# Patient Record
Sex: Male | Born: 1950 | Race: White | Hispanic: No | Marital: Married | Smoking: Never smoker
Health system: Southern US, Community
[De-identification: ages and names within clinical notes are randomized; demographics above are authoritative.]

## PROBLEM LIST (undated history)

## (undated) DIAGNOSIS — M1990 Unspecified osteoarthritis, unspecified site: Secondary | ICD-10-CM

## (undated) DIAGNOSIS — I10 Essential (primary) hypertension: Secondary | ICD-10-CM

## (undated) DIAGNOSIS — Z87442 Personal history of urinary calculi: Secondary | ICD-10-CM

## (undated) HISTORY — PX: HERNIA REPAIR: SHX51

## (undated) HISTORY — PX: BACK SURGERY: SHX140

---
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
## 2005-05-08 ENCOUNTER — Other Ambulatory Visit: Payer: Self-pay

## 2005-05-14 ENCOUNTER — Ambulatory Visit: Payer: Self-pay | Admitting: Surgery

## 2006-08-09 ENCOUNTER — Ambulatory Visit: Payer: Self-pay | Admitting: Family Medicine

## 2006-10-14 ENCOUNTER — Ambulatory Visit: Payer: Self-pay | Admitting: Unknown Physician Specialty

## 2014-06-17 ENCOUNTER — Ambulatory Visit: Payer: Self-pay | Admitting: Family Medicine

## 2015-06-27 DIAGNOSIS — N138 Other obstructive and reflux uropathy: Secondary | ICD-10-CM | POA: Insufficient documentation

## 2015-06-27 DIAGNOSIS — R350 Frequency of micturition: Secondary | ICD-10-CM | POA: Insufficient documentation

## 2015-06-27 DIAGNOSIS — N401 Enlarged prostate with lower urinary tract symptoms: Secondary | ICD-10-CM | POA: Insufficient documentation

## 2015-09-12 ENCOUNTER — Other Ambulatory Visit: Payer: Self-pay | Admitting: Neurosurgery

## 2015-12-29 DIAGNOSIS — Z7189 Other specified counseling: Secondary | ICD-10-CM | POA: Insufficient documentation

## 2015-12-29 DIAGNOSIS — Z7185 Encounter for immunization safety counseling: Secondary | ICD-10-CM | POA: Insufficient documentation

## 2016-01-31 ENCOUNTER — Other Ambulatory Visit (HOSPITAL_COMMUNITY): Payer: Self-pay | Admitting: *Deleted

## 2016-01-31 ENCOUNTER — Encounter (HOSPITAL_COMMUNITY)
Admission: RE | Admit: 2016-01-31 | Discharge: 2016-01-31 | Disposition: A | Discharge status: Home / Self Care | Payer: Medicare Other | Source: Ambulatory Visit | Attending: Neurosurgery | Admitting: Neurosurgery

## 2016-01-31 ENCOUNTER — Encounter (HOSPITAL_COMMUNITY): Payer: Self-pay

## 2016-01-31 DIAGNOSIS — M1990 Unspecified osteoarthritis, unspecified site: Secondary | ICD-10-CM | POA: Insufficient documentation

## 2016-01-31 DIAGNOSIS — M5386 Other specified dorsopathies, lumbar region: Secondary | ICD-10-CM | POA: Diagnosis not present

## 2016-01-31 DIAGNOSIS — Z01812 Encounter for preprocedural laboratory examination: Secondary | ICD-10-CM | POA: Insufficient documentation

## 2016-01-31 DIAGNOSIS — I10 Essential (primary) hypertension: Secondary | ICD-10-CM | POA: Diagnosis not present

## 2016-01-31 DIAGNOSIS — Z01810 Encounter for preprocedural cardiovascular examination: Secondary | ICD-10-CM | POA: Diagnosis not present

## 2016-01-31 DIAGNOSIS — I4510 Unspecified right bundle-branch block: Secondary | ICD-10-CM | POA: Insufficient documentation

## 2016-01-31 DIAGNOSIS — R001 Bradycardia, unspecified: Secondary | ICD-10-CM | POA: Insufficient documentation

## 2016-01-31 HISTORY — DX: Unspecified osteoarthritis, unspecified site: M19.90

## 2016-01-31 HISTORY — DX: Essential (primary) hypertension: I10

## 2016-01-31 HISTORY — DX: Personal history of urinary calculi: Z87.442

## 2016-01-31 LAB — BASIC METABOLIC PANEL
Anion gap: 9 (ref 5–15)
BUN: 11 mg/dL (ref 6–20)
CALCIUM: 9.7 mg/dL (ref 8.9–10.3)
CO2: 27 mmol/L (ref 22–32)
CREATININE: 0.8 mg/dL (ref 0.61–1.24)
Chloride: 101 mmol/L (ref 101–111)
GFR calc non Af Amer: >60 mL/min (ref >60)
Glucose, Bld: 86 mg/dL (ref 65–99)
Potassium: 3.8 mmol/L (ref 3.5–5.1)
SODIUM: 137 mmol/L (ref 135–145)

## 2016-01-31 LAB — CBC
HCT: 46 % (ref 39.0–52.0)
Hemoglobin: 16.1 g/dL (ref 13.0–17.0)
MCH: 32.1 pg (ref 26.0–34.0)
MCHC: 35 g/dL (ref 30.0–36.0)
MCV: 91.6 fL (ref 78.0–100.0)
PLATELETS: 230 10*3/uL (ref 150–400)
RBC: 5.02 MIL/uL (ref 4.22–5.81)
RDW: 12.6 % (ref 11.5–15.5)
WBC: 7.2 10*3/uL (ref 4.0–10.5)

## 2016-01-31 LAB — TYPE AND SCREEN
ABO/RH(D): A NEG
ANTIBODY SCREEN: NEGATIVE

## 2016-01-31 LAB — SURGICAL PCR SCREEN
MRSA, PCR: NEGATIVE
Staphylococcus aureus: NEGATIVE

## 2016-01-31 LAB — ABO/RH: ABO/RH(D): A NEG

## 2016-01-31 NOTE — Progress Notes (Signed)
Pt. Denies all chest & breathing concerns. Pt. Followed at [facility] office, states prior to hernia surgery he had a treadmill stress test (approx. 10 yrs. Ago).  Pt. Told that it was wnl, not referred to cardiology for ongoing care.

## 2016-01-31 NOTE — Progress Notes (Signed)
   01/31/16 0957  OBSTRUCTIVE SLEEP APNEA  Do you snore loudly (loud enough to be heard through closed doors)?  0  Do you often feel tired, fatigued, or sleepy during the daytime (such as falling asleep during driving or talking to someone)? 1  Has anyone observed you stop breathing during your sleep? 0  Do you have, or are you being treated for high blood pressure? 1  BMI more than 35 kg/m2? 0  Neck circumference greater than:Male 16 inches or larger, Male 17inches or larger? 1 (19 inches )  Male Gender (Yes=1) 1  Obstructive Sleep Apnea Score 4  Score 5 or greater  No PCP;Results sent to PCP

## 2016-01-31 NOTE — Progress Notes (Signed)
Faxed sleep report to pcp- Dr. Burnadette PopLinthavong

## 2016-01-31 NOTE — Pre-Procedure Instructions (Signed)
Nathan Curtis  01/31/2016      [facility] [place] - [address] [place] [id number] Phone: [phone] Fax: [phone]    Your procedure is scheduled on 02/08/2016  [facility] Admitting at 6:30 A.M.  Call this number if you have problems the morning of surgery:  [phone]   Remember:  Do not eat food or drink liquids after midnight.  Take these medicines the morning of surgery with A SIP OF WATER : NOTHING   Do not wear jewelry   Do not wear lotions, powders, or perfumes, or deoderant.     Men may shave face and neck.   Do not bring valuables to the hospital.   [facility] is not responsible for any belongings or valuables.  Contacts, dentures or bridgework may not be worn into surgery.  Leave your suitcase in the car.  After surgery it may be brought to your room.  For patients admitted to the hospital, discharge time will be determined by your treatment team.  Patients discharged the day of surgery will not be allowed to drive home.   Name and phone number of your driver:  Wife  Special instructions:  Special Instructions:  - Preparing for Surgery  Before surgery, you can play an important role.  Because skin is not sterile, your skin needs to be as free of germs as possible.  You can reduce the number of germs on you skin by washing with CHG (chlorahexidine gluconate) soap before surgery.  CHG is an antiseptic cleaner which kills germs and bonds with the skin to continue killing germs even after washing.  Please DO NOT use if you have an allergy to CHG or antibacterial soaps.  If your skin becomes reddened/irritated stop using the CHG and inform your nurse when you arrive at Short Stay.  Do not shave (including legs and underarms) for at least 48 hours prior to the first CHG shower.  You may shave your face.  Please follow these instructions carefully:   1.  Shower with  CHG Soap the night before surgery and the  morning of Surgery.  2.  If you choose to wash your hair, wash your hair first as usual with your  normal shampoo.  3.  After you shampoo, rinse your hair and body thoroughly to remove the  Shampoo.  4.  Use CHG as you would any other liquid soap.  You can apply chg directly to the skin and wash gently with scrungie or a clean washcloth.  5.  Apply the CHG Soap to your body ONLY FROM THE NECK DOWN.    Do not use on open wounds or open sores.  Avoid contact with your eyes, ears, mouth and genitals (private parts).  Wash genitals (private parts)   with your normal soap.  6.  Wash thoroughly, paying special attention to the area where your surgery will be performed.  7.  Thoroughly rinse your body with warm water from the neck down.  8.  DO NOT shower/wash with your normal soap after using and rinsing off   the CHG Soap.  9.  Pat yourself dry with a clean towel.            10.  Wear clean pajamas.            11.  Place clean sheets on your bed the night of your first shower and do  not sleep with pets.  Day of Surgery  Do not apply any lotions/deodorants the morning of surgery.  Please wear clean clothes to the hospital/surgery center.  Please read over the following fact sheets that you were given. Pain Booklet, Coughing and Deep Breathing, MRSA Information and Surgical Site Infection Prevention

## 2016-02-08 ENCOUNTER — Inpatient Hospital Stay (HOSPITAL_COMMUNITY): Payer: Medicare Other

## 2016-02-08 ENCOUNTER — Inpatient Hospital Stay (HOSPITAL_COMMUNITY): Payer: Medicare Other | Admitting: Anesthesiology

## 2016-02-08 ENCOUNTER — Encounter (HOSPITAL_COMMUNITY)
Admission: RE | Disposition: A | Discharge status: Home / Self Care | Payer: Self-pay | Source: Ambulatory Visit | Attending: Neurosurgery

## 2016-02-08 ENCOUNTER — Inpatient Hospital Stay (HOSPITAL_COMMUNITY)
Admission: RE | Admit: 2016-02-08 | Discharge: 2016-02-10 | DRG: 460 | Disposition: A | Discharge status: Home / Self Care | Payer: Medicare Other | Source: Ambulatory Visit | Attending: Neurosurgery | Admitting: Neurosurgery

## 2016-02-08 DIAGNOSIS — M5116 Intervertebral disc disorders with radiculopathy, lumbar region: Secondary | ICD-10-CM | POA: Diagnosis present

## 2016-02-08 DIAGNOSIS — I10 Essential (primary) hypertension: Secondary | ICD-10-CM | POA: Diagnosis present

## 2016-02-08 DIAGNOSIS — M48062 Spinal stenosis, lumbar region with neurogenic claudication: Secondary | ICD-10-CM | POA: Diagnosis present

## 2016-02-08 DIAGNOSIS — M4316 Spondylolisthesis, lumbar region: Secondary | ICD-10-CM | POA: Diagnosis present

## 2016-02-08 DIAGNOSIS — Z7951 Long term (current) use of inhaled steroids: Secondary | ICD-10-CM

## 2016-02-08 DIAGNOSIS — Z7982 Long term (current) use of aspirin: Secondary | ICD-10-CM | POA: Diagnosis not present

## 2016-02-08 DIAGNOSIS — Z419 Encounter for procedure for purposes other than remedying health state, unspecified: Secondary | ICD-10-CM

## 2016-02-08 DIAGNOSIS — M549 Dorsalgia, unspecified: Secondary | ICD-10-CM | POA: Diagnosis present

## 2016-02-08 SURGERY — POSTERIOR LUMBAR FUSION 2 LEVEL
Anesthesia: General | Site: Back

## 2016-02-08 MED ORDER — LOSARTAN POTASSIUM 50 MG PO TABS
50.0000 mg | ORAL_TABLET | Freq: Every day | ORAL | Status: DC
  Administered 2016-02-09 – 2016-02-10 (×2): 50 mg via ORAL
  Filled 2016-02-08 (×2): qty 1

## 2016-02-08 MED ORDER — THROMBIN 20000 UNITS EX SOLR
CUTANEOUS | Status: AC
  Filled 2016-02-08: qty 20000

## 2016-02-08 MED ORDER — SODIUM CHLORIDE 0.9 % IR SOLN
Status: DC | PRN
  Administered 2016-02-08: 10:00:00

## 2016-02-08 MED ORDER — MIDAZOLAM HCL 2 MG/2ML IJ SOLN
INTRAMUSCULAR | Status: AC
  Filled 2016-02-08: qty 2

## 2016-02-08 MED ORDER — SIMVASTATIN 20 MG PO TABS
20.0000 mg | ORAL_TABLET | Freq: Every day | ORAL | Status: DC
  Administered 2016-02-08 – 2016-02-09 (×2): 20 mg via ORAL
  Filled 2016-02-08 (×2): qty 1

## 2016-02-08 MED ORDER — HYDROCHLOROTHIAZIDE 25 MG PO TABS
25.0000 mg | ORAL_TABLET | Freq: Every day | ORAL | Status: DC
  Administered 2016-02-09 – 2016-02-10 (×2): 25 mg via ORAL
  Filled 2016-02-08 (×2): qty 1

## 2016-02-08 MED ORDER — ADULT MULTIVITAMIN W/MINERALS CH
1.0000 | ORAL_TABLET | Freq: Every day | ORAL | Status: DC
  Administered 2016-02-10: 1 via ORAL
  Filled 2016-02-08: qty 1

## 2016-02-08 MED ORDER — EPHEDRINE SULFATE 50 MG/ML IJ SOLN
INTRAMUSCULAR | Status: DC | PRN
  Administered 2016-02-08: 5 mg via INTRAVENOUS
  Administered 2016-02-08: 10 mg via INTRAVENOUS
  Administered 2016-02-08: 5 mg via INTRAVENOUS

## 2016-02-08 MED ORDER — FLUTICASONE PROPIONATE 50 MCG/ACT NA SUSP
1.0000 | Freq: Every day | NASAL | Status: DC | PRN
  Filled 2016-02-08: qty 16

## 2016-02-08 MED ORDER — FENTANYL CITRATE (PF) 100 MCG/2ML IJ SOLN
INTRAMUSCULAR | Status: AC
  Filled 2016-02-08: qty 4

## 2016-02-08 MED ORDER — ALBUMIN HUMAN 5 % IV SOLN
INTRAVENOUS | Status: DC | PRN
  Administered 2016-02-08: 11:00:00 via INTRAVENOUS

## 2016-02-08 MED ORDER — MENTHOL 3 MG MT LOZG: 1.0000 | LOZENGE | OROMUCOSAL | Status: DC | PRN

## 2016-02-08 MED ORDER — VANCOMYCIN HCL 1000 MG IV SOLR
INTRAVENOUS | Status: DC | PRN
  Administered 2016-02-08: 1000 mg via TOPICAL

## 2016-02-08 MED ORDER — LACTATED RINGERS IV SOLN
INTRAVENOUS | Status: DC | PRN
  Administered 2016-02-08 (×3): via INTRAVENOUS

## 2016-02-08 MED ORDER — BACITRACIN ZINC 500 UNIT/GM EX OINT
TOPICAL_OINTMENT | CUTANEOUS | Status: AC
  Filled 2016-02-08: qty 28.35

## 2016-02-08 MED ORDER — MORPHINE SULFATE (PF) 4 MG/ML IV SOLN
1.0000 mg | INTRAVENOUS | Status: DC | PRN
  Administered 2016-02-08: 4 mg via INTRAVENOUS
  Filled 2016-02-08: qty 1

## 2016-02-08 MED ORDER — OXYCODONE HCL 5 MG/5ML PO SOLN: 5.0000 mg | Freq: Once | ORAL | Status: DC | PRN

## 2016-02-08 MED ORDER — FENTANYL CITRATE (PF) 100 MCG/2ML IJ SOLN
INTRAMUSCULAR | Status: DC | PRN
Start: 2016-02-08 — End: 2016-02-08
  Administered 2016-02-08: 100 ug via INTRAVENOUS
  Administered 2016-02-08 (×4): 50 ug via INTRAVENOUS

## 2016-02-08 MED ORDER — ARTIFICIAL TEARS OP OINT
TOPICAL_OINTMENT | OPHTHALMIC | Status: AC
  Filled 2016-02-08: qty 7

## 2016-02-08 MED ORDER — ONDANSETRON HCL 4 MG/2ML IJ SOLN
4.0000 mg | INTRAMUSCULAR | Status: DC | PRN
  Administered 2016-02-08 – 2016-02-09 (×2): 4 mg via INTRAVENOUS
  Filled 2016-02-08 (×2): qty 2

## 2016-02-08 MED ORDER — PROPOFOL 10 MG/ML IV BOLUS
INTRAVENOUS | Status: DC | PRN
  Administered 2016-02-08: 200 mg via INTRAVENOUS

## 2016-02-08 MED ORDER — VANCOMYCIN HCL 1000 MG IV SOLR
INTRAVENOUS | Status: AC
  Filled 2016-02-08: qty 1000

## 2016-02-08 MED ORDER — HYDROMORPHONE HCL 2 MG/ML IJ SOLN
INTRAMUSCULAR | Status: AC
  Filled 2016-02-08: qty 1

## 2016-02-08 MED ORDER — BUPIVACAINE-EPINEPHRINE (PF) 0.5% -1:200000 IJ SOLN
INTRAMUSCULAR | Status: DC | PRN
  Administered 2016-02-08: 10 mL

## 2016-02-08 MED ORDER — OXYCODONE HCL 5 MG PO TABS: 5.0000 mg | ORAL_TABLET | Freq: Once | ORAL | Status: DC | PRN

## 2016-02-08 MED ORDER — ACETAMINOPHEN 325 MG PO TABS
650.0000 mg | ORAL_TABLET | ORAL | Status: DC | PRN
  Administered 2016-02-09 – 2016-02-10 (×3): 650 mg via ORAL
  Filled 2016-02-08 (×3): qty 2

## 2016-02-08 MED ORDER — FENTANYL CITRATE (PF) 100 MCG/2ML IJ SOLN
INTRAMUSCULAR | Status: AC
  Filled 2016-02-08: qty 2

## 2016-02-08 MED ORDER — DEXTROSE 5 % IV SOLN
INTRAVENOUS | Status: DC | PRN
  Administered 2016-02-08: 50 ug/min via INTRAVENOUS

## 2016-02-08 MED ORDER — BISACODYL 10 MG RE SUPP: 10.0000 mg | Freq: Every day | RECTAL | Status: DC | PRN

## 2016-02-08 MED ORDER — PHENOL 1.4 % MT LIQD: 1.0000 | OROMUCOSAL | Status: DC | PRN

## 2016-02-08 MED ORDER — PHENYLEPHRINE HCL 10 MG/ML IJ SOLN
INTRAMUSCULAR | Status: AC
  Filled 2016-02-08: qty 1

## 2016-02-08 MED ORDER — DOCUSATE SODIUM 100 MG PO CAPS
100.0000 mg | ORAL_CAPSULE | Freq: Two times a day (BID) | ORAL | Status: DC
  Administered 2016-02-08 – 2016-02-10 (×4): 100 mg via ORAL
  Filled 2016-02-08 (×4): qty 1

## 2016-02-08 MED ORDER — HYDROCODONE-ACETAMINOPHEN 5-325 MG PO TABS: 1.0000 | ORAL_TABLET | ORAL | Status: DC | PRN

## 2016-02-08 MED ORDER — OXYCODONE-ACETAMINOPHEN 5-325 MG PO TABS
ORAL_TABLET | ORAL | Status: AC
  Filled 2016-02-08: qty 2

## 2016-02-08 MED ORDER — LIDOCAINE HCL (CARDIAC) 20 MG/ML IV SOLN
INTRAVENOUS | Status: DC | PRN
  Administered 2016-02-08: 60 mg via INTRAVENOUS

## 2016-02-08 MED ORDER — DIAZEPAM 5 MG PO TABS
ORAL_TABLET | ORAL | Status: AC
  Filled 2016-02-08: qty 1

## 2016-02-08 MED ORDER — ARTIFICIAL TEARS OP OINT
TOPICAL_OINTMENT | OPHTHALMIC | Status: DC | PRN
  Administered 2016-02-08: 1 via OPHTHALMIC

## 2016-02-08 MED ORDER — ACETAMINOPHEN 650 MG RE SUPP: 650.0000 mg | RECTAL | Status: DC | PRN

## 2016-02-08 MED ORDER — ONDANSETRON HCL 4 MG/2ML IJ SOLN
INTRAMUSCULAR | Status: DC | PRN
  Administered 2016-02-08: 4 mg via INTRAVENOUS

## 2016-02-08 MED ORDER — CEFAZOLIN SODIUM-DEXTROSE 2-4 GM/100ML-% IV SOLN
2.0000 g | INTRAVENOUS | Status: AC
  Administered 2016-02-08 (×2): 2 g via INTRAVENOUS
  Filled 2016-02-08: qty 100

## 2016-02-08 MED ORDER — THROMBIN 20000 UNITS EX SOLR
CUTANEOUS | Status: DC | PRN
  Administered 2016-02-08: 10:00:00 via TOPICAL

## 2016-02-08 MED ORDER — ONDANSETRON HCL 4 MG/2ML IJ SOLN
INTRAMUSCULAR | Status: AC
  Filled 2016-02-08: qty 2

## 2016-02-08 MED ORDER — MIDAZOLAM HCL 5 MG/5ML IJ SOLN
INTRAMUSCULAR | Status: DC | PRN
  Administered 2016-02-08: 2 mg via INTRAVENOUS

## 2016-02-08 MED ORDER — SUGAMMADEX SODIUM 200 MG/2ML IV SOLN
INTRAVENOUS | Status: AC
  Filled 2016-02-08: qty 2

## 2016-02-08 MED ORDER — ONDANSETRON HCL 4 MG/2ML IJ SOLN
4.0000 mg | Freq: Four times a day (QID) | INTRAMUSCULAR | Status: AC | PRN
  Administered 2016-02-08: 4 mg via INTRAVENOUS

## 2016-02-08 MED ORDER — PHENYLEPHRINE HCL 10 MG/ML IJ SOLN
INTRAMUSCULAR | Status: DC | PRN
  Administered 2016-02-08: 80 ug via INTRAVENOUS

## 2016-02-08 MED ORDER — ROCURONIUM BROMIDE 100 MG/10ML IV SOLN
INTRAVENOUS | Status: DC | PRN
  Administered 2016-02-08: 20 mg via INTRAVENOUS
  Administered 2016-02-08: 50 mg via INTRAVENOUS
  Administered 2016-02-08: 10 mg via INTRAVENOUS
  Administered 2016-02-08: 20 mg via INTRAVENOUS

## 2016-02-08 MED ORDER — ALUM & MAG HYDROXIDE-SIMETH 200-200-20 MG/5ML PO SUSP
30.0000 mL | Freq: Four times a day (QID) | ORAL | Status: DC | PRN
  Filled 2016-02-08 (×2): qty 30

## 2016-02-08 MED ORDER — BUPIVACAINE LIPOSOME 1.3 % IJ SUSP
INTRAMUSCULAR | Status: DC | PRN
  Administered 2016-02-08: 20 mL

## 2016-02-08 MED ORDER — PROPOFOL 10 MG/ML IV BOLUS
INTRAVENOUS | Status: AC
  Filled 2016-02-08: qty 20

## 2016-02-08 MED ORDER — BACITRACIN ZINC 500 UNIT/GM EX OINT
TOPICAL_OINTMENT | CUTANEOUS | Status: DC | PRN
  Administered 2016-02-08: 1 via TOPICAL

## 2016-02-08 MED ORDER — DIAZEPAM 5 MG PO TABS
5.0000 mg | ORAL_TABLET | Freq: Four times a day (QID) | ORAL | Status: DC | PRN
  Administered 2016-02-08 – 2016-02-10 (×3): 5 mg via ORAL
  Filled 2016-02-08 (×2): qty 1

## 2016-02-08 MED ORDER — OXYCODONE-ACETAMINOPHEN 5-325 MG PO TABS
1.0000 | ORAL_TABLET | ORAL | Status: DC | PRN
  Administered 2016-02-08 – 2016-02-09 (×4): 2 via ORAL
  Filled 2016-02-08 (×3): qty 2

## 2016-02-08 MED ORDER — LACTATED RINGERS IV SOLN: INTRAVENOUS | Status: DC

## 2016-02-08 MED ORDER — MORPHINE SULFATE (PF) 2 MG/ML IV SOLN: 1.0000 mg | INTRAVENOUS | Status: DC | PRN

## 2016-02-08 MED ORDER — CEFAZOLIN SODIUM-DEXTROSE 2-4 GM/100ML-% IV SOLN
2.0000 g | Freq: Three times a day (TID) | INTRAVENOUS | Status: AC
  Administered 2016-02-08 (×2): 2 g via INTRAVENOUS
  Filled 2016-02-08 (×2): qty 100

## 2016-02-08 MED ORDER — CEFAZOLIN SODIUM 1 G IJ SOLR
INTRAMUSCULAR | Status: AC
  Filled 2016-02-08: qty 20

## 2016-02-08 MED ORDER — HYDROMORPHONE HCL 1 MG/ML IJ SOLN
0.2500 mg | INTRAMUSCULAR | Status: DC | PRN
  Administered 2016-02-08 (×2): 0.5 mg via INTRAVENOUS

## 2016-02-08 MED FILL — Sodium Chloride IV Soln 0.9%: INTRAVENOUS | Qty: 1000 | Status: AC

## 2016-02-08 MED FILL — Heparin Sodium (Porcine) Inj 1000 Unit/ML: INTRAMUSCULAR | Qty: 30 | Status: AC

## 2016-02-08 SURGICAL SUPPLY — 73 items
BAG DECANTER FOR FLEXI CONT (MISCELLANEOUS) ×3 IMPLANT
BASKET BONE COLLECTION (BASKET) ×3 IMPLANT
BENZOIN TINCTURE PRP APPL 2/3 (GAUZE/BANDAGES/DRESSINGS) ×3 IMPLANT
BLADE CLIPPER SURG (BLADE) ×3 IMPLANT
BLADE SURG 15 STRL LF DISP TIS (BLADE) ×1 IMPLANT
BLADE SURG 15 STRL SS (BLADE) ×2
BUR MATCHSTICK NEURO 3.0 LAGG (BURR) ×3 IMPLANT
BUR PRECISION FLUTE 6.0 (BURR) ×3 IMPLANT
CAGE ALTERA 10X31X9-13 15D (Cage) ×2 IMPLANT
CAGE ALTERA 9-13-15-31MM (Cage) ×1 IMPLANT
CANISTER SUCT 3000ML PPV (MISCELLANEOUS) ×3 IMPLANT
CAP REVERE LOCKING (Cap) ×18 IMPLANT
CLOSURE WOUND 1/2 X4 (GAUZE/BANDAGES/DRESSINGS) ×1
CONN CROSSLINK REV 38-50MM (Connector) ×3 IMPLANT
CONNECTOR CRSLINK REV 38-50MM (Connector) ×1 IMPLANT
CONT SPEC 4OZ CLIKSEAL STRL BL (MISCELLANEOUS) ×3 IMPLANT
COVER BACK TABLE 60X90IN (DRAPES) ×3 IMPLANT
COVER TABLE BACK 60X90 (DRAPES) ×3 IMPLANT
DEVICE RISE INTERBODY CREO (Neuro Prosthesis/Implant) ×2 IMPLANT
DRAPE C-ARM 42X72 X-RAY (DRAPES) ×6 IMPLANT
DRAPE HALF SHEET 40X57 (DRAPES) ×3 IMPLANT
DRAPE LAPAROTOMY 100X72X124 (DRAPES) ×3 IMPLANT
DRAPE POUCH INSTRU U-SHP 10X18 (DRAPES) ×3 IMPLANT
DRAPE SURG 17X23 STRL (DRAPES) ×12 IMPLANT
ELECT BLADE 4.0 EZ CLEAN MEGAD (MISCELLANEOUS) ×3
ELECT REM PT RETURN 9FT ADLT (ELECTROSURGICAL) ×3
ELECTRODE BLDE 4.0 EZ CLN MEGD (MISCELLANEOUS) ×1 IMPLANT
ELECTRODE REM PT RTRN 9FT ADLT (ELECTROSURGICAL) ×1 IMPLANT
GAUZE SPONGE 4X4 12PLY STRL (GAUZE/BANDAGES/DRESSINGS) ×3 IMPLANT
GAUZE SPONGE 4X4 16PLY XRAY LF (GAUZE/BANDAGES/DRESSINGS) ×3 IMPLANT
GLOVE BIO SURGEON STRL SZ7 (GLOVE) ×9 IMPLANT
GLOVE BIO SURGEON STRL SZ8 (GLOVE) ×9 IMPLANT
GLOVE BIO SURGEON STRL SZ8.5 (GLOVE) ×6 IMPLANT
GLOVE BIOGEL PI IND STRL 6.5 (GLOVE) ×1 IMPLANT
GLOVE BIOGEL PI IND STRL 7.5 (GLOVE) ×2 IMPLANT
GLOVE BIOGEL PI INDICATOR 6.5 (GLOVE) ×2
GLOVE BIOGEL PI INDICATOR 7.5 (GLOVE) ×4
GLOVE EXAM NITRILE LRG STRL (GLOVE) IMPLANT
GLOVE EXAM NITRILE XL STR (GLOVE) IMPLANT
GLOVE EXAM NITRILE XS STR PU (GLOVE) IMPLANT
GLOVE INDICATOR 8.5 STRL (GLOVE) ×3 IMPLANT
GLOVE SURG SS PI 6.5 STRL IVOR (GLOVE) ×9 IMPLANT
GOWN STRL REUS W/ TWL LRG LVL3 (GOWN DISPOSABLE) ×2 IMPLANT
GOWN STRL REUS W/ TWL XL LVL3 (GOWN DISPOSABLE) ×3 IMPLANT
GOWN STRL REUS W/TWL 2XL LVL3 (GOWN DISPOSABLE) IMPLANT
GOWN STRL REUS W/TWL LRG LVL3 (GOWN DISPOSABLE) ×4
GOWN STRL REUS W/TWL XL LVL3 (GOWN DISPOSABLE) ×6
KIT BASIN OR (CUSTOM PROCEDURE TRAY) ×3 IMPLANT
KIT ROOM TURNOVER OR (KITS) ×3 IMPLANT
NEEDLE HYPO 21X1.5 SAFETY (NEEDLE) ×3 IMPLANT
NEEDLE HYPO 22GX1.5 SAFETY (NEEDLE) ×3 IMPLANT
NS IRRIG 1000ML POUR BTL (IV SOLUTION) ×3 IMPLANT
PACK LAMINECTOMY NEURO (CUSTOM PROCEDURE TRAY) ×3 IMPLANT
PAD ARMBOARD 7.5X6 YLW CONV (MISCELLANEOUS) ×9 IMPLANT
PATTIES SURGICAL .5 X.5 (GAUZE/BANDAGES/DRESSINGS) ×3 IMPLANT
PATTIES SURGICAL .5 X1 (DISPOSABLE) IMPLANT
PATTIES SURGICAL 1X1 (DISPOSABLE) ×3 IMPLANT
RISE INTERBODY CREO (Neuro Prosthesis/Implant) ×6 IMPLANT
ROD REVERE 7.5MM (Rod) ×6 IMPLANT
SCREW 7.5X50MM (Screw) ×18 IMPLANT
SPONGE LAP 4X18 X RAY DECT (DISPOSABLE) IMPLANT
SPONGE NEURO XRAY DETECT 1X3 (DISPOSABLE) IMPLANT
SPONGE SURGIFOAM ABS GEL 100 (HEMOSTASIS) ×3 IMPLANT
STRIP BIOACTIVE 20CC 25X100X8 (Miscellaneous) ×3 IMPLANT
STRIP CLOSURE SKIN 1/2X4 (GAUZE/BANDAGES/DRESSINGS) ×2 IMPLANT
SUT VIC AB 1 CT1 18XBRD ANBCTR (SUTURE) ×2 IMPLANT
SUT VIC AB 1 CT1 8-18 (SUTURE) ×4
SUT VIC AB 2-0 CP2 18 (SUTURE) ×6 IMPLANT
TAPE CLOTH SURG 4X10 WHT LF (GAUZE/BANDAGES/DRESSINGS) ×3 IMPLANT
TOWEL OR 17X24 6PK STRL BLUE (TOWEL DISPOSABLE) ×3 IMPLANT
TOWEL OR 17X26 10 PK STRL BLUE (TOWEL DISPOSABLE) ×3 IMPLANT
TRAY FOLEY W/METER SILVER 16FR (SET/KITS/TRAYS/PACK) ×3 IMPLANT
WATER STERILE IRR 1000ML POUR (IV SOLUTION) ×3 IMPLANT

## 2016-02-08 NOTE — Anesthesia Procedure Notes (Signed)
Procedure Name: Intubation Date/Time: 02/08/2016 8:39 AM Performed by: Carmela RimaMARTINELLI, Tayllor Breitenstein F Pre-anesthesia Checklist: Timeout performed, Patient being monitored, Emergency Drugs available and Patient identified Patient Re-evaluated:Patient Re-evaluated prior to inductionOxygen Delivery Method: Circle system utilized Preoxygenation: Pre-oxygenation with 100% oxygen Intubation Type: IV induction Ventilation: Mask ventilation without difficulty Laryngoscope Size: Mac, 3 and Glidescope Grade View: Grade III Tube type: Oral Tube size: 7.5 mm Number of attempts: 2 Placement Confirmation: breath sounds checked- equal and bilateral,  positive ETCO2 and ETT inserted through vocal cords under direct vision Secured at: 23 cm Tube secured with: Tape Dental Injury: Teeth and Oropharynx as per pre-operative assessment  Comments: Narrow mandible.  Anterior anatomy

## 2016-02-08 NOTE — Anesthesia Postprocedure Evaluation (Signed)
Anesthesia Post Note  Patient: Nathan Curtis  Procedure(s) Performed: Procedure(s) (LRB): Lumbar two-three, Lumbar three-four Posterior lumbar interbody fusion/Interbody prosthesis/Posterior segmental instrumentation/Posterior lumbar arthrodesis (N/A)  Patient location during evaluation: PACU Anesthesia Type: General Level of consciousness: awake and alert and patient cooperative Pain management: pain level controlled Vital Signs Assessment: post-procedure vital signs reviewed and stable Respiratory status: spontaneous breathing and respiratory function stable Cardiovascular status: stable Anesthetic complications: no    Last Vitals:  Vitals:   02/08/16 1515 02/08/16 1530  BP: (!) 131/53 (!) 101/54  Pulse: 73 74  Resp: 15 13  Temp:      Last Pain:  Vitals:   02/08/16 1515  TempSrc:   PainSc: 6                  Sted[place] S

## 2016-02-08 NOTE — Op Note (Signed)
Brief history: The patient is a 65 year old white male who has complained of back, buttock, and leg pain consistent with neurogenic claudication. He has failed medical management and was worked up with lumbar x-rays and a lumbar MRI. This demonstrated degenerative disc disease, spondylolisthesis, spinal stenosis, etc. at L2-3 and L3-4. I discussed the various treatment options with the patient including surgery. He has weighed the risks, benefits, and alternatives to surgery and decided proceed with an L2-3 and L3-4 decompression, instrumentation, and fusion.  Preoperative diagnosis: L2-3 and L3-4 Degenerative disc disease, L3-4 spondylolisthesis, spinal stenosis compressing both the L3 and L4 nerve roots; lumbago; lumbar radiculopathy  Postoperative diagnosis: The same   Procedure: L3-4 laminectomy, L2-3 bilateral Laminotomy/foraminotomies to decompress the bilateral L3 and L4 nerve roots(the work required to do this was in addition to the work required to do the posterior lumbar interbody fusion because of the patient's spinal stenosis, facet arthropathy. Etc. requiring a wide decompression of the nerve roots.); L2-3 and L3-4 posterior lumbar interbody fusion with local morselized autograft bone and Kinnex graft extender; insertion of interbody prosthesis at L2-3 and L3-4 (globus peek interbody prosthesis); posterior segmental instrumentation from L2 to L4 with globus titanium pedicle screws and rods; posterior lateral arthrodesis at L2-3 and L3-4 with local morselized autograft bone and Kinnex bone graft extender.  Surgeon: Dr. Delma OfficerJeff Aleisha Paone  Asst.: Dr. Wynetta Emeryram  Anesthesia: Gen. endotracheal  Estimated blood loss: 300 mL  Drains: None  Complications: None  Description of procedure: The patient was brought to the operating room by the anesthesia team. General endotracheal anesthesia was induced. The patient was turned to the prone position on the Wilson frame. The patient's lumbosacral region was  then prepared with Betadine scrub and Betadine solution. Sterile drapes were applied.  I then injected the area to be incised with Marcaine with epinephrine solution. I then used the scalpel to make a linear midline incision over the L2-3 and L3-4 interspace. I then used electrocautery to perform a bilateral subperiosteal dissection exposing the spinous process and lamina of L2, L3 and L4. We then obtained intraoperative radiograph to confirm our location. We then inserted the Verstrac retractor to provide exposure.  I began the decompression incising the L2-3 and L3-4 interspinous ligament. I used the Leksell to remove the L3 spinous process. We used the high speed drill to perform laminotomies at L2-3 and L3-4 bilaterally. We then used the Kerrison punches to complete the L3 laminectomy and to widen the laminotomies bilaterally at L2-3 widen the laminotomy and removed the ligamentum flavum at L2-3 and L3-4. We used the Kerrison punches to remove the medial facets at L2-3 and L3-4 bilaterally. We performed wide foraminotomies about the bilateral L3 and L4 nerve roots completing the decompression.  We now turned our attention to the posterior lumbar interbody fusion. I used a scalpel to incise the intervertebral disc at L2-3 and L3-4 bilaterally. I then performed a partial intervertebral discectomy at L2-3 and L3-4 bilaterally using the pituitary forceps. We prepared the vertebral endplates at L2-3 and L3-4 bilaterally for the fusion by removing the soft tissues with the curettes. We then used the trial spacers to pick the appropriate sized interbody prosthesis. We prefilled his prosthesis with a combination of local morselized autograft bone that we obtained during the decompression as well as Kinnex bone graft extender. We inserted the prefilled prosthesis into the interspace at L2-3 bilaterally and L3-4 from the left, we expanded the prosthesis. There was a good snug fit of the prosthesis in the  interspace.  We then filled and the remainder of the intervertebral disc space with local morselized autograft bone and Kinnex. This completed the posterior lumbar and transforaminal interbody arthrodesis.  We now turned attention to the instrumentation. Under fluoroscopic guidance we cannulated the bilateral L2, L3 and L4 pedicles with the bone probe. We then removed the bone probe. We then tapped the pedicle with a 6.5 millimeter tap. We then removed the tap. We probed inside the tapped pedicle with a ball probe to rule out cortical breaches. We then inserted a 7.5 x 50 millimeter pedicle screw into the L2, L3 and L4 pedicles bilaterally under fluoroscopic guidance. We then palpated along the medial aspect of the pedicles to rule out cortical breaches. There were none. The nerve roots were not injured. We then connected the unilateral pedicle screws with a lordotic rod. We compressed the construct and secured the rod in place with the caps. We then tightened the caps appropriately. We placed a cross connector between the rods.  This completed the instrumentation from L2-L4.  We now turned our attention to the posterior lateral arthrodesis at L2-3 and L3-4 bilaterally. We used the high-speed drill to decorticate the remainder of the facets, pars, transverse process at L2-3 and L3-4 bilaterally. We then applied a combination of local morselized autograft bone and Kinnex bone graft extender over these decorticated posterior lateral structures. This completed the posterior lateral arthrodesis.  We then obtained hemostasis using bipolar electrocautery. We irrigated the wound out with bacitracin solution. We inspected the thecal sac and nerve roots and noted they were well decompressed. We then removed the retractor. We placed vancomycin powder in the wound. We reapproximated patient's thoracolumbar fascia with interrupted #1 Vicryl suture. We reapproximated patient's subcutaneous tissue with interrupted 2-0 Vicryl suture. The  reapproximated patient's skin with Steri-Strips and benzoin. The wound was then coated with bacitracin ointment. A sterile dressing was applied. The drapes were removed. The patient was subsequently returned to the supine position where they were extubated by the anesthesia team. He was then transported to the post anesthesia care unit in stable condition. All sponge instrument and needle counts were reportedly correct at the end of this case.

## 2016-02-08 NOTE — Progress Notes (Signed)
Report given to jamie rn as caregiver 

## 2016-02-08 NOTE — Progress Notes (Signed)
Patient ID: Nathan Curtis, male   DOB: 07-May-1950, 65 y.o.   MRN: [id number] Subjective:  The patient is somnolent but easily arousable. He is no apparent distress. He looks well.  Objective: Vital signs in last 24 hours: Temp:  [97.8 F (36.6 C)-98.5 F (36.9 C)] 97.8 F (36.6 C) (11/08 1413) Pulse Rate:  [68-78] 78 (11/08 1430) Resp:  [14-20] 14 (11/08 1430) BP: (140-165)/(70-87) 140/70 (11/08 1430) SpO2:  [94 %-100 %] 100 % (11/08 1430) Weight:  [102.1 kg (225 lb)] 102.1 kg (225 lb) (11/08 0652)  Intake/Output from previous day: No intake/output data recorded. Intake/Output this shift: Total I/O In: 2625 [I.V.:2375; IV Piggyback:250] Out: 580 [Urine:280; Blood:300]  Physical exam the patient is somnolent but arousable. He is moving his lower extremities well.  Lab Results: No results for input(s): WBC, HGB, HCT, PLT in the last 72 hours. BMET No results for input(s): NA, K, CL, CO2, GLUCOSE, BUN, CREATININE, CALCIUM in the last 72 hours.  Studies/Results: Dg Lumbar Spine 2-3 Views  Result Date: 02/08/2016 CLINICAL DATA:  Intraoperative fluoro spot images from PLIF at L2-3 and L3-4. EXAM: LUMBAR SPINE - 2-3 VIEW; DG C-ARM 61-120 MIN COMPARISON:  Lumbar spine series of May 27, 2015 and lateral localization lumbar radiograph of today's date. FINDINGS: Fluoro time reported is 23 seconds. AP and lateral fluoro spot images reveal the patient to be undergoing PLIF at L2-3 and L3-4. The intradiscal devices are present. The pedicle screws have been placed. Connecting rods are not yet present. IMPRESSION: Intraoperative fluoro spot images revealing PLIF at L2-3 and L3-4 without immediate complication. Electronically Signed   By: David  Swazil[place]Jordan M.D.   On: 02/08/2016 13:54   Dg Lumbar Spine 1 View  Result Date: 02/08/2016 CLINICAL DATA:  Intraoperative localization film EXAM: LUMBAR SPINE - 1 VIEW COMPARISON:  05/27/2015 FINDINGS: Single lateral view of the lumbar spine submitted.  There is a posterior localization instrument at the mid level of L3 vertebral body. Disc space flattening with vacuum disc phenomenon at L5-S1 level. Disc space flattening with anterior spurring and vacuum disc phenomenon at L2-L3 level. IMPRESSION: Posterior localization instrument at the level of mid aspect of L3 vertebral body. Electronically Signed   By: Natasha MeadLiviu  Pop M.D.   On: 02/08/2016 11:44   Dg C-arm 1-60 Min  Result Date: 02/08/2016 CLINICAL DATA:  Intraoperative fluoro spot images from PLIF at L2-3 and L3-4. EXAM: LUMBAR SPINE - 2-3 VIEW; DG C-ARM 61-120 MIN COMPARISON:  Lumbar spine series of May 27, 2015 and lateral localization lumbar radiograph of today's date. FINDINGS: Fluoro time reported is 23 seconds. AP and lateral fluoro spot images reveal the patient to be undergoing PLIF at L2-3 and L3-4. The intradiscal devices are present. The pedicle screws have been placed. Connecting rods are not yet present. IMPRESSION: Intraoperative fluoro spot images revealing PLIF at L2-3 and L3-4 without immediate complication. Electronically Signed   By: David  [place]Jordan M.D.   On: 02/08/2016 13:54    Assessment/Plan: The patient is doing well. I spoke with his wife.  LOS: 0 days     Trish Mancinelli D 02/08/2016, 2:50 PM

## 2016-02-08 NOTE — Anesthesia Preprocedure Evaluation (Addendum)
Anesthesia Evaluation  Patient identified by MRN, date of birth, ID band Patient awake    Reviewed: Allergy & Precautions, H&P , NPO status , Patient's Chart, lab work & pertinent test results  Airway Mallampati: II   Neck ROM: full    Dental  (+) Dental Advidsory Given, Teeth Intact   Pulmonary neg pulmonary ROS,    breath sounds clear to auscultation       Cardiovascular hypertension, On Medications  Rhythm:regular Rate:Normal     Neuro/Psych    GI/Hepatic   Endo/Other  obese  Renal/GU      Musculoskeletal  (+) Arthritis ,   Abdominal   Peds  Hematology   Anesthesia Other Findings   Reproductive/Obstetrics                            Anesthesia Physical Anesthesia Plan  ASA: II  Anesthesia Plan: General   Post-op Pain Management:    Induction: Intravenous  Airway Management Planned: Oral ETT  Additional Equipment:   Intra-op Plan:   Post-operative Plan: Extubation in OR  Informed Consent: I have reviewed the patients History and Physical, chart, labs and discussed the procedure including the risks, benefits and alternatives for the proposed anesthesia with the patient or authorized representative who has indicated his/her understanding and acceptance.   Dental Advisory Given  Plan Discussed with: CRNA, Anesthesiologist and Surgeon  Anesthesia Plan Comments:        Anesthesia Quick Evaluation

## 2016-02-08 NOTE — Transfer of Care (Signed)
Immediate Anesthesia Transfer of Care Note  Patient: Nathan Curtis  Procedure(s) Performed: Procedure(s): Lumbar two-three, Lumbar three-four Posterior lumbar interbody fusion/Interbody prosthesis/Posterior segmental instrumentation/Posterior lumbar arthrodesis (N/A)  Patient Location: PACU  Anesthesia Type:General  Level of Consciousness: awake and alert   Airway & Oxygen Therapy: Patient Spontanous Breathing and Patient connected to face mask oxygen  Post-op Assessment: Report given to RN, Post -op Vital signs reviewed and stable and Patient moving all extremities X 4  Post vital signs: Reviewed and stable  Last Vitals:  Vitals:   02/08/16 0652  BP: (!) 165/70  Pulse: 68  Resp: 20  Temp: 36.9 C    Last Pain:  Vitals:   02/08/16 0723  TempSrc:   PainSc: 0-No pain      Patients Stated Pain Goal: 2 (02/08/16 0723)  Complications: No apparent anesthesia complications

## 2016-02-08 NOTE — Progress Notes (Signed)
Orthopedic Tech Progress Note Patient Details:  Nathan Curtis [birth year] [id number]  Patient already has aspen lumbar brace. Patient ID: Nathan Curtis, male   DOB: [birth year], 65 y.o.   MRN: [id number]   Nathan Curtis 02/08/2016, 5:25 PM

## 2016-02-08 NOTE — H&P (Signed)
Subjective: Is a 65 year old white male who has complained of back, buttock, and leg pain consistent with neurogenic claudication. He has failed medical management and was worked up with lumbar x-rays and lumbar MRI. This demonstrated him spinal listhesis, stenosis, disc degeneration, etc. at L2-3 and L3-4. I discussed the various treatment options. He has decided to proceed with surgery.   Past Medical History:  Diagnosis Date  . Arthritis    lumbar spondyolisthesis   . History of kidney stones 1970   passed spontaneously  . Hypertension     Past Surgical History:  Procedure Laterality Date  . HERNIA REPAIR Right 1969, 2010   inguinal     Allergies  Allergen Reactions  . No Known Allergies     Social History  Substance Use Topics  . Smoking status: Never Smoker  . Smokeless tobacco: Never Used  . Alcohol use No    No family history on file. Prior to Admission medications   Medication Sig Start Date End Date Taking? Authorizing Provider  docusate sodium (COLACE) 100 MG capsule Take 100 mg by mouth daily as needed for mild constipation.   Yes Historical Provider, MD  fluticasone (FLONASE) 50 MCG/ACT nasal spray Place 1-2 sprays into both nostrils daily as needed for allergies or rhinitis.   Yes Historical Provider, MD  hydrochlorothiazide (HYDRODIURIL) 25 MG tablet Take 25 mg by mouth daily.   Yes Historical Provider, MD  ibuprofen (ADVIL,MOTRIN) 200 MG tablet Take 400 mg by mouth at bedtime as needed for moderate pain.   Yes Historical Provider, MD  losartan (COZAAR) 50 MG tablet Take 50 mg by mouth daily.   Yes Historical Provider, MD  Multiple Vitamin (MULTIVITAMIN WITH MINERALS) TABS tablet Take 1 tablet by mouth daily.   Yes Historical Provider, MD  simvastatin (ZOCOR) 20 MG tablet Take 20 mg by mouth at bedtime.   Yes Historical Provider, MD  aspirin EC 81 MG tablet Take 81 mg by mouth daily.    Historical Provider, MD     Review of Systems  Positive ROS: As above  All  other systems have been reviewed and were otherwise negative with the exception of those mentioned in the HPI and as above.  Objective: Vital signs in last 24 hours: Temp:  [98.5 F (36.9 C)] 98.5 F (36.9 C) (11/08 16100652) Pulse Rate:  [68] 68 (11/08 0652) Resp:  [20] 20 (11/08 0652) BP: (165)/(70) 165/70 (11/08 0652) SpO2:  [94 %] 94 % (11/08 0652) Weight:  [102.1 kg (225 lb)] 102.1 kg (225 lb) (11/08 [id number])  General Appearance: Alert, cooperative, no distress, Head: Normocephalic, without obvious abnormality, atraumatic Eyes: PERRL, conjunctiva/corneas clear, EOM's intact,    Ears: Normal  Throat: Normal  Neck: Supple, symmetrical, trachea midline, no adenopathy; thyroid: No enlargement/tenderness/nodules; no carotid bruit or JVD Back: Symmetric, no curvature, ROM normal, no CVA tenderness Lungs: Clear to auscultation bilaterally, respirations unlabored Heart: Regular rate and rhythm, no murmur, rub or gallop Abdomen: Soft, non-tender,, no masses, no organomegaly Extremities: Extremities normal, atraumatic, no cyanosis or edema Pulses: 2+ and symmetric all extremities Skin: Skin color, texture, turgor normal, no rashes or lesions  NEUROLOGIC:   Mental status: alert and oriented, no aphasia, good attention span, Fund of knowledge/ memory ok Motor Exam - grossly normal Sensory Exam - grossly normal Reflexes:  Coordination - grossly normal Gait - grossly normal Balance - grossly normal Cranial Nerves: I: smell Not tested  II: visual acuity  OS: Normal  OD: Normal   II: visual fields  Full to confrontation  II: pupils Equal, round, reactive to light  III,VII: ptosis None  III,IV,VI: extraocular muscles  Full ROM  V: mastication Normal  V: facial light touch sensation  Normal  V,VII: corneal reflex  Present  VII: facial muscle function - upper  Normal  VII: facial muscle function - lower Normal  VIII: hearing Not tested  IX: soft palate elevation  Normal  IX,X: gag reflex  Present  XI: trapezius strength  5/5  XI: sternocleidomastoid strength 5/5  XI: neck flexion strength  5/5  XII: tongue strength  Normal    Data Review Lab Results  Component Value Date   WBC 7.2 01/31/2016   HGB 16.1 01/31/2016   HCT 46.0 01/31/2016   MCV 91.6 01/31/2016   PLT 230 01/31/2016   Lab Results  Component Value Date   NA 137 01/31/2016   K 3.8 01/31/2016   CL 101 01/31/2016   CO2 27 01/31/2016   BUN 11 01/31/2016   CREATININE 0.80 01/31/2016   GLUCOSE 86 01/31/2016   No results found for: INR, PROTIME  Assessment/Plan: L2-3 and L3-4 disc degeneration, spondylolisthesis, spinal stenosis, lumbago, lumbar radiculopathy, neurogenic claudication: I have discussed the situation with the patient and reviewed his imaging studies with him. We have discussed the various treatment options including surgery. I have described surgical treatment option of an L2-3 and L3-4 decompression, instrumentation and fusion. I have described the surgery to him. I have shown him surgical models. We have discussed the risks, benefits, alternatives, expected postoperative course, and likelihood of achieving her goals with surgery. I have answered all the patient's questions. He has decided to proceed with surgery.   Nathan Curtis D 02/08/2016 8:26 AM

## 2016-02-09 LAB — BASIC METABOLIC PANEL
ANION GAP: 9 (ref 5–15)
BUN: 14 mg/dL (ref 6–20)
CALCIUM: 8.4 mg/dL — AB (ref 8.9–10.3)
CO2: 29 mmol/L (ref 22–32)
Chloride: 100 mmol/L — ABNORMAL LOW (ref 101–111)
Creatinine, Ser: 0.95 mg/dL (ref 0.61–1.24)
GFR calc Af Amer: >60 mL/min (ref >60)
GFR calc non Af Amer: >60 mL/min (ref >60)
GLUCOSE: 134 mg/dL — AB (ref 65–99)
POTASSIUM: 4 mmol/L (ref 3.5–5.1)
Sodium: 138 mmol/L (ref 135–145)

## 2016-02-09 LAB — CBC
HEMATOCRIT: 34.1 % — AB (ref 39.0–52.0)
Hemoglobin: 11.8 g/dL — ABNORMAL LOW (ref 13.0–17.0)
MCH: 32.3 pg (ref 26.0–34.0)
MCHC: 34.6 g/dL (ref 30.0–36.0)
MCV: 93.4 fL (ref 78.0–100.0)
Platelets: 202 10*3/uL (ref 150–400)
RBC: 3.65 MIL/uL — ABNORMAL LOW (ref 4.22–5.81)
RDW: 13 % (ref 11.5–15.5)
WBC: 12.7 10*3/uL — AB (ref 4.0–10.5)

## 2016-02-09 MED ORDER — PROMETHAZINE HCL 25 MG/ML IJ SOLN
12.5000 mg | Freq: Four times a day (QID) | INTRAMUSCULAR | Status: DC | PRN
  Administered 2016-02-09: 12.5 mg via INTRAMUSCULAR
  Filled 2016-02-09: qty 1

## 2016-02-09 MED ORDER — PROMETHAZINE HCL 25 MG PO TABS: 25.0000 mg | ORAL_TABLET | Freq: Four times a day (QID) | ORAL | Status: DC | PRN

## 2016-02-09 MED ORDER — HYDROMORPHONE HCL 2 MG PO TABS
2.0000 mg | ORAL_TABLET | ORAL | Status: DC | PRN
  Administered 2016-02-09: 2 mg via ORAL
  Administered 2016-02-09 – 2016-02-10 (×2): 4 mg via ORAL
  Administered 2016-02-10: 2 mg via ORAL
  Administered 2016-02-10: 4 mg via ORAL
  Filled 2016-02-09 (×2): qty 1
  Filled 2016-02-09: qty 2
  Filled 2016-02-09 (×2): qty 1
  Filled 2016-02-09: qty 2

## 2016-02-09 NOTE — Evaluation (Signed)
Physical Therapy Evaluation Patient Details Name: Nathan Curtis MRN: [id number] DOB: 11-Sep-1950 Today's Date: 02/09/2016   History of Present Illness  Patient is a 65 y/o male with hx of HTN presents s/p L2-3, 3-4 PLIF.   Clinical Impression  Patient presents with pain, nausea and post surgical deficits s/p above. Tolerated gait training with min guard assist for safety. Performed stair training with Min guard for safety as well. Education re: back precautions, brace, positioning etc. Pt will have support from wife at home. Pt independent PTA. Will follow acutely to maximize independence and mobility prior to return home.     Follow Up Recommendations No PT follow up;Supervision - Intermittent    Equipment Recommendations  Rolling walker with 5" wheels    Recommendations for Other Services       Precautions / Restrictions Precautions Precautions: Back Precaution Booklet Issued: Yes (comment) Precaution Comments: Provided handout and reviewed precautions Required Braces or Orthoses: Spinal Brace Spinal Brace: Lumbar corset Restrictions Weight Bearing Restrictions: No      Mobility  Bed Mobility Overal bed mobility: Needs Assistance Bed Mobility: Rolling;Sidelying to Sit Rolling: Modified independent (Device/Increase time) Sidelying to sit: Modified independent (Device/Increase time)       General bed mobility comments: HOB flat, no use of rails to stimulate home. Cues for log roll technique.  Transfers Overall transfer level: Needs assistance Equipment used: Rolling walker (2 wheeled) Transfers: Sit to/from Stand Sit to Stand: Min guard         General transfer comment: Min guard for safety. + nausea  Ambulation/Gait   Ambulation Distance (Feet): 300 Feet Assistive device: Rolling walker (2 wheeled) Gait Pattern/deviations: Step-through pattern;Decreased stride length Gait velocity: decreased Gait velocity interpretation: Below normal speed for  age/gender General Gait Details: Slow, guarded gait with bil knee instability. Cues for upright.  Stairs Stairs: Yes Stairs assistance: Min guard Stair Management: Step to pattern;Sideways;Forwards;One rail Right Number of Stairs: 4 General stair comments: Cues for technique and safety. Forward to ascend and sideways to descend.  Wheelchair Mobility    Modified Rankin (Stroke Patients Only)       Balance Overall balance assessment: Needs assistance Sitting-balance support: Feet supported;No upper extremity supported Sitting balance-Leahy Scale: Good Sitting balance - Comments: Able to donn brace without assist.    Standing balance support: During functional activity Standing balance-Leahy Scale: Fair Standing balance comment: Able to stand without UE support statically.                             Pertinent Vitals/Pain Pain Assessment: Faces Faces Pain Scale: Hurts a little bit Pain Location: incision Pain Descriptors / Indicators: Sore;Operative site guarding Pain Intervention(s): Monitored during session;Repositioned    Home Living Family/patient expects to be discharged to:: Private residence Living Arrangements: Spouse/significant other Available Help at Discharge: Family Type of Home: House Home Access: Stairs to enter Entrance Stairs-Rails: Right Entrance Stairs-Number of Steps: 3 Home Layout: Two level;Able to live on main level with bedroom/bathroom Home Equipment: None      Prior Function Level of Independence: Independent         Comments: Drives, owns a cabinet business     Hand Dominance        Extremity/Trunk Assessment   Upper Extremity Assessment: Defer to OT evaluation           Lower Extremity Assessment: Generalized weakness (Numbness in right toes)      Cervical / Trunk Assessment: Other exceptions  Communication   Communication: No difficulties  Cognition Arousal/Alertness: Awake/alert Behavior During  Therapy: WFL for tasks assessed/performed Overall Cognitive Status: Within Functional Limits for tasks assessed                      General Comments General comments (skin integrity, edema, etc.): Wife present during session.    Exercises     Assessment/Plan    PT Assessment Patient needs continued PT services  PT Problem List Decreased strength;Decreased mobility;Decreased knowledge of precautions;Decreased balance;Pain;Impaired sensation          PT Treatment Interventions Therapeutic activities;DME instruction;Gait training;Therapeutic exercise;Patient/family education;Balance training;Stair training;Functional mobility training    PT Goals (Current goals can be found in the Care Plan section)  Acute Rehab PT Goals Patient Stated Goal: to return to work PT Goal Formulation: With patient Time For Goal Achievement: 02/23/16 Potential to Achieve Goals: Good    Frequency Min 5X/week   Barriers to discharge        Co-evaluation               End of Session Equipment Utilized During Treatment: Back brace Activity Tolerance: Patient tolerated treatment well Patient left: in chair;with call bell/phone within reach;with family/visitor present Nurse Communication: Mobility status         Time: [id number] PT Time Calculation (min) (ACUTE ONLY): 29 min   Charges:   PT Evaluation $PT Eval Low Complexity: 1 Procedure PT Treatments $Gait Training: 8-22 mins   PT G Codes:        Brinn Westby A Koury Roddy 02/09/2016, 8:16 AM Mylo RedShauna Yarden Hillis, PT, DPT [phone]

## 2016-02-09 NOTE — Progress Notes (Signed)
Patient ID: Nathan Curtis, male   DOB: 1950-09-07, 65 y.o.   MRN: [id number] Subjective:  The patient is alert and pleasant. He looks well. He's had some nausea and vomiting with the Percocet. He is doing better on Dilaudid.  Objective: Vital signs in last 24 hours: Temp:  [97.8 F (36.6 C)-98.9 F (37.2 C)] 98.8 F (37.1 C) (11/09 1155) Pulse Rate:  [69-89] 80 (11/09 1155) Resp:  [10-20] 16 (11/09 1155) BP: (101-141)/(41-87) 137/75 (11/09 1155) SpO2:  [94 %-100 %] 98 % (11/09 1155)  Intake/Output from previous day: 11/08 0701 - 11/09 0700 In: 2965 [P.O.:240; I.V.:2375; IV Piggyback:350] Out: 1980 [Urine:1380; Emesis/NG output:300; Blood:300] Intake/Output this shift: No intake/output data recorded.  Physical exam the patient is alert and oriented. His strength is grossly normal his lower extremities.  Lab Results:  Recent Labs  02/09/16 0430  WBC 12.7*  HGB 11.8*  HCT 34.1*  PLT 202   BMET  Recent Labs  02/09/16 0430  NA 138  K 4.0  CL 100*  CO2 29  GLUCOSE 134*  BUN 14  CREATININE 0.95  CALCIUM 8.4*    Studies/Results: Dg Lumbar Spine 2-3 Views  Result Date: 02/08/2016 CLINICAL DATA:  Intraoperative fluoro spot images from PLIF at L2-3 and L3-4. EXAM: LUMBAR SPINE - 2-3 VIEW; DG C-ARM 61-120 MIN COMPARISON:  Lumbar spine series of May 27, 2015 and lateral localization lumbar radiograph of today's date. FINDINGS: Fluoro time reported is 23 seconds. AP and lateral fluoro spot images reveal the patient to be undergoing PLIF at L2-3 and L3-4. The intradiscal devices are present. The pedicle screws have been placed. Connecting rods are not yet present. IMPRESSION: Intraoperative fluoro spot images revealing PLIF at L2-3 and L3-4 without immediate complication. Electronically Signed   By: David  SwazilandJordan M.D.   On: 02/08/2016 13:54   Dg Lumbar Spine 1 View  Result Date: 02/08/2016 CLINICAL DATA:  Intraoperative localization film EXAM: LUMBAR SPINE - 1 VIEW  COMPARISON:  05/27/2015 FINDINGS: Single lateral view of the lumbar spine submitted. There is a posterior localization instrument at the mid level of L3 vertebral body. Disc space flattening with vacuum disc phenomenon at L5-S1 level. Disc space flattening with anterior spurring and vacuum disc phenomenon at L2-L3 level. IMPRESSION: Posterior localization instrument at the level of mid aspect of L3 vertebral body. Electronically Signed   By: Natasha MeadLiviu  Pop M.D.   On: 02/08/2016 11:44   Dg C-arm 1-60 Min  Result Date: 02/08/2016 CLINICAL DATA:  Intraoperative fluoro spot images from PLIF at L2-3 and L3-4. EXAM: LUMBAR SPINE - 2-3 VIEW; DG C-ARM 61-120 MIN COMPARISON:  Lumbar spine series of May 27, 2015 and lateral localization lumbar radiograph of today's date. FINDINGS: Fluoro time reported is 23 seconds. AP and lateral fluoro spot images reveal the patient to be undergoing PLIF at L2-3 and L3-4. The intradiscal devices are present. The pedicle screws have been placed. Connecting rods are not yet present. IMPRESSION: Intraoperative fluoro spot images revealing PLIF at L2-3 and L3-4 without immediate complication. Electronically Signed   By: David  [place]Jordan M.D.   On: 02/08/2016 13:54    Assessment/Plan: Postop day #1: The patient is doing well neurologically. He will likely go home tomorrow. I gave him his discharge instructions and answered all his questions. I'll ask Dr. Conchita ParisNundkumar to see the patient in my absence.  LOS: 1 day     Phillips Goulette D 02/09/2016, 12:40 PM

## 2016-02-10 MED ORDER — ASPIRIN EC 81 MG PO TBEC: 81.0000 mg | DELAYED_RELEASE_TABLET | Freq: Every day | ORAL | Status: DC

## 2016-02-10 MED ORDER — CYCLOBENZAPRINE HCL 10 MG PO TABS: 10.0000 mg | ORAL_TABLET | Freq: Three times a day (TID) | ORAL | 0 refills | Status: DC | PRN

## 2016-02-10 MED ORDER — HYDROMORPHONE HCL 2 MG PO TABS: 2.0000 mg | ORAL_TABLET | Freq: Four times a day (QID) | ORAL | 0 refills | Status: DC | PRN

## 2016-02-10 NOTE — Discharge Instructions (Signed)
Wound Care Keep incision covered and dry for two days.   Do not put any creams, lotions, or ointments on incision. Leave steri-strips on back.  They will fall off by themselves. Activity Walk each and every day, increasing distance each day. No lifting greater than 5 lbs.  Avoid bending, lifting and twisting. No driving for 2 weeks; may ride as a passenger locally. If provided with back brace, wear when out of bed.  It is not necessary to wear brace in bed. Diet Resume your normal diet.  Return to Work Will be discussed at you follow up appointment. Call Your Doctor If Any of These Occur Redness, drainage, or swelling at the wound.  Temperature greater than 101 degrees. Severe pain not relieved by pain medication. Incision starts to come apart. Follow Up Appt Call today for appointment in 1-2 weeks ([phone]) or for problems.  If you have any hardware placed in your spine, you will need an x-ray before your appointment.  

## 2016-02-10 NOTE — Progress Notes (Signed)
Physical Therapy Treatment/Discharge Note Patient Details Name: Nathan Curtis MRN: [id number] DOB: Oct 16, 1950 Today's Date: 02/10/2016    History of Present Illness Patient is a 65 y/o male with hx of HTN presents s/p L2-3, 3-4 PLIF.     PT Comments    Pt demonstrates good understanding of spinal precautions. Tolerated gait and stair training with modified independence without c/o increased pain or excessive fatigue. Pt expressed concern for navigating around home with low furniture- this was addressed in today's session with education/demonstration for techniques and strategies to avoid bending,lifting,twisting. Trialed ambulating without RW during today's session but pt had increased balance deficits with gait. Pt planning to discharge to home and has no further PT needs at this time.    Follow Up Recommendations  No PT follow up;Supervision - Intermittent     Equipment Recommendations  Rolling walker with 5" wheels    Recommendations for Other Services       Precautions / Restrictions Precautions Precautions: Back Required Braces or Orthoses: Spinal Brace Spinal Brace: Lumbar corset Restrictions Weight Bearing Restrictions: No    Mobility  Bed Mobility Overal bed mobility: Needs Assistance Bed Mobility: Rolling;Sidelying to Sit;Supine to Sit;Sit to Supine Rolling: Supervision Sidelying to sit: Supervision Supine to sit: Supervision Sit to supine: Supervision   General bed mobility comments: HOB flat, no use of rails to simulate home. Cues for log roll technique.  Transfers Overall transfer level: Modified independent Equipment used: Rolling walker (2 wheeled) Transfers: Sit to/from Stand Sit to Stand: Modified independent (Device/Increase time)         General transfer comment: Pt demonstrates good technique with sit to stand; educated for ease of transfer with low surface  Ambulation/Gait Ambulation/Gait assistance: Supervision Ambulation Distance (Feet):  400 Feet Assistive device: Rolling walker (2 wheeled) Gait Pattern/deviations: Step-through pattern;Decreased stride length Gait velocity: decreased Gait velocity interpretation: Below normal speed for age/gender General Gait Details: Pt required moderate cues for upright trunk position. Attempted ambulation without RW; however, pt developed increased antalgic gait without RW support   Stairs Stairs: Yes Stairs assistance: Supervision Stair Management: One rail Right;Alternating pattern;Sideways (alternating with ascent; sideways with descent) Number of Stairs: 4 General stair comments: pt demonstrates good understanding of safety and technique  Wheelchair Mobility    Modified Rankin (Stroke Patients Only)       Balance Overall balance assessment: Needs assistance Sitting-balance support: Feet supported;No upper extremity supported Sitting balance-Leahy Scale: Good Sitting balance - Comments: able to maintain seated position without UE assist   Standing balance support: Single extremity supported;During functional activity Standing balance-Leahy Scale: Fair Standing balance comment: able to stand without UE support statically                    Cognition Arousal/Alertness: Awake/alert Behavior During Therapy: WFL for tasks assessed/performed Overall Cognitive Status: Within Functional Limits for tasks assessed                      Exercises      General Comments        Pertinent Vitals/Pain Pain Assessment: 0-10 Pain Score: 4  Pain Location: incision Pain Descriptors / Indicators: Aching;Sore Pain Intervention(s): Monitored during session;Repositioned    Home Living                      Prior Function            PT Goals (current goals can now be found in the care plan section)  Acute Rehab PT Goals Patient Stated Goal: to return to work Progress towards PT goals: Goals met/education completed, patient discharged from PT     Frequency    Min 5X/week      PT Plan Current plan remains appropriate    Co-evaluation             End of Session Equipment Utilized During Treatment: Back brace;Gait belt Activity Tolerance: Patient tolerated treatment well Patient left: in bed;with call bell/phone within reach     Time: 1219-7588 PT Time Calculation (min) (ACUTE ONLY): 20 min  Charges:  $Gait Training: 8-22 mins                    G Codes:      Tonia Brooms 2016/02/12, 11:43 AM Tonia Brooms, SPT [phone]

## 2016-02-10 NOTE — Progress Notes (Signed)
Patient alert and oriented, mae's well, voiding adequate amount of urine, swallowing without difficulty, no c/o pain at time of discharge. Patient discharged home with family. Script and discharged instructions given to patient. Patient and family stated understanding of instructions given. Patient has an appointment with Dr. Jenkins   

## 2016-02-10 NOTE — Discharge Summary (Signed)
  Physician Discharge Summary  Patient ID: Nathan Curtis MRN: [id number] DOB/AGE: [birth year] 65 y.o.  Admit date: 02/08/2016 Discharge date: 02/10/2016  Admission Diagnoses: Spondylolisthesis, lumbar  Discharge Diagnoses: Same Active Problems:   Spondylolisthesis of lumbar region   Discharged Condition: Stable  Hospital Course:  [name] is a 65 y.o. male electively admitted after 2 level lumbar fusion. He had an uneventful hospital course, was ambulating with rolling walker, voiding normally, and tolerating diet. Pain was controlled with oral medication.  Treatments: Surgery - L2-4 PLIF  Discharge Exam: Blood pressure (!) 146/55, pulse 71, temperature 99.1 F (37.3 C), temperature source Oral, resp. rate 18, height 5\' 10"  (1.778 m), weight 102.1 kg (225 lb), SpO2 96 %. Awake, alert, oriented Speech fluent, appropriate CN grossly intact 5/5 BUE/BLE Wound c/Curtis/i  Disposition: Final discharge disposition not confirmed     Medication List    STOP taking these medications   ibuprofen 200 MG tablet Commonly known as:  ADVIL,MOTRIN     TAKE these medications   aspirin EC 81 MG tablet Take 1 tablet (81 mg total) by mouth daily. Start taking on:  02/17/2016   cyclobenzaprine 10 MG tablet Commonly known as:  FLEXERIL Take 1 tablet (10 mg total) by mouth 3 (three) times daily as needed for muscle spasms.   docusate sodium 100 MG capsule Commonly known as:  COLACE Take 100 mg by mouth daily as needed for mild constipation.   fluticasone 50 MCG/ACT nasal spray Commonly known as:  FLONASE Place 1-2 sprays into both nostrils daily as needed for allergies or rhinitis.   hydrochlorothiazide 25 MG tablet Commonly known as:  HYDRODIURIL Take 25 mg by mouth daily.   HYDROmorphone 2 MG tablet Commonly known as:  DILAUDID Take 1 tablet (2 mg total) by mouth every 6 (six) hours as needed for severe pain.   losartan 50 MG tablet Commonly known as:  COZAAR Take  50 mg by mouth daily.   multivitamin with minerals Tabs tablet Take 1 tablet by mouth daily.   simvastatin 20 MG tablet Commonly known as:  ZOCOR Take 20 mg by mouth at bedtime.            Durable Medical Equipment        Start     Ordered   02/09/16 0919  For home use only DME Walker rolling  Once     02/09/16 0918     Follow-up Information    Nathan LoronJENKINS,Nathan D, MD Follow up.   Specialty:  Neurosurgery Contact information: [address] [place] [address] [phone]           Signed: Lisbeth RenshawUNDKUMAR, Nathan Curtis, Nathan Curtis 02/10/2016, 9:44 AM

## 2016-06-26 DIAGNOSIS — M79606 Pain in leg, unspecified: Secondary | ICD-10-CM | POA: Insufficient documentation

## 2016-06-26 DIAGNOSIS — G5793 Unspecified mononeuropathy of bilateral lower limbs: Secondary | ICD-10-CM | POA: Insufficient documentation

## 2017-12-27 IMAGING — RF DG C-ARM 61-120 MIN
1 series · 2 of 2 positions shown · non-contrast
Comparison: Lumbar spine series of May 27, 2015 and lateral
localization lumbar radiograph of today's date.

CLINICAL DATA: Intraoperative fluoro spot images from PLIF at L2-3
and L3-4.

EXAM:
LUMBAR SPINE - 2-3 VIEW; DG C-ARM 61-120 MIN

[Series 1: run · 2 of 2 slices shown]
[im 1/2]
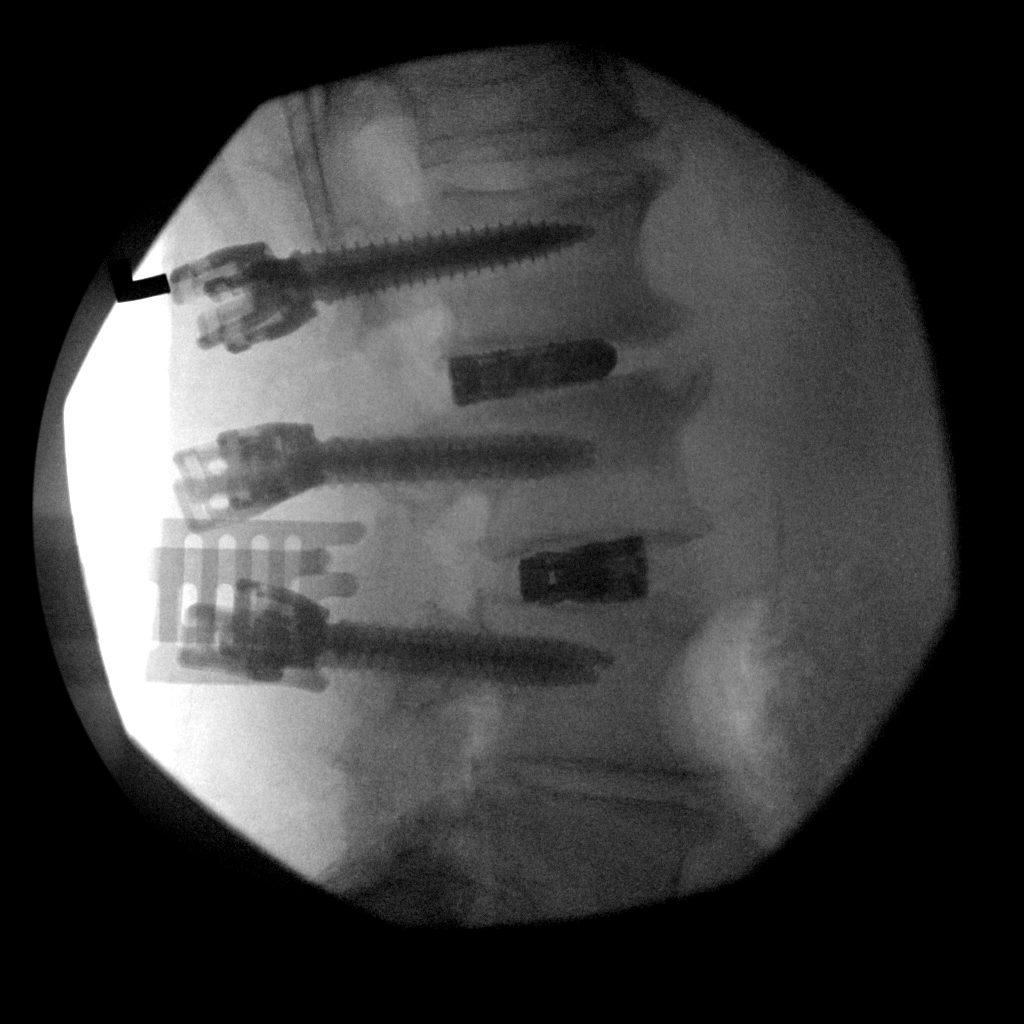
[im 2/2]
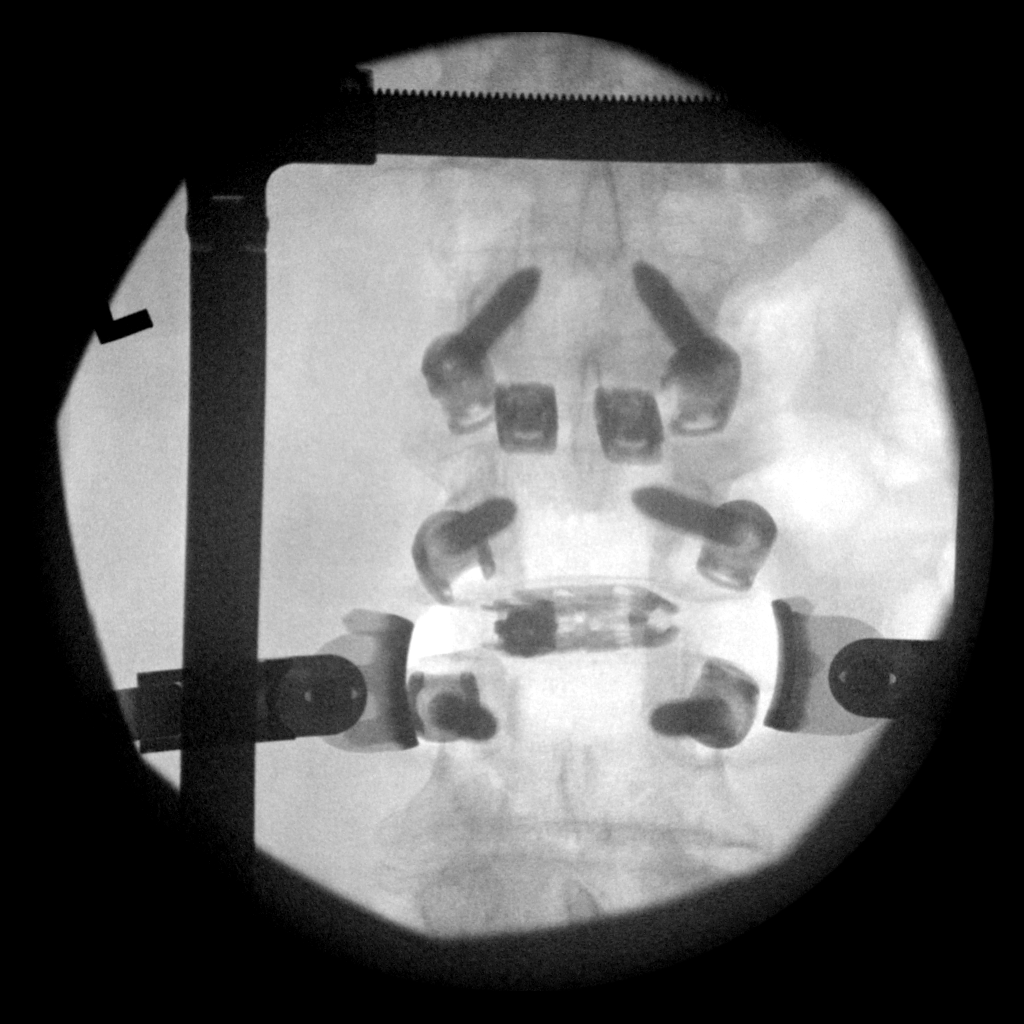

[2 of 2 positions shown; findings below may reference images not displayed]

FINDINGS: Fluoro time reported is 23 seconds. AP and lateral fluoro spot
images reveal the patient to be undergoing PLIF at L2-3 and L3-4.
The intradiscal devices are present. The pedicle screws have been
placed. Connecting rods are not yet present.
IMPRESSION: Intraoperative fluoro spot images revealing PLIF at L2-3 and L3-4
without immediate complication.

## 2017-12-27 IMAGING — CR DG LUMBAR SPINE 1V
1 series · 1 of 1 positions shown · non-contrast
Comparison: 05/27/2015

CLINICAL DATA: Intraoperative localization film

EXAM:
LUMBAR SPINE - 1 VIEW

[lateral]
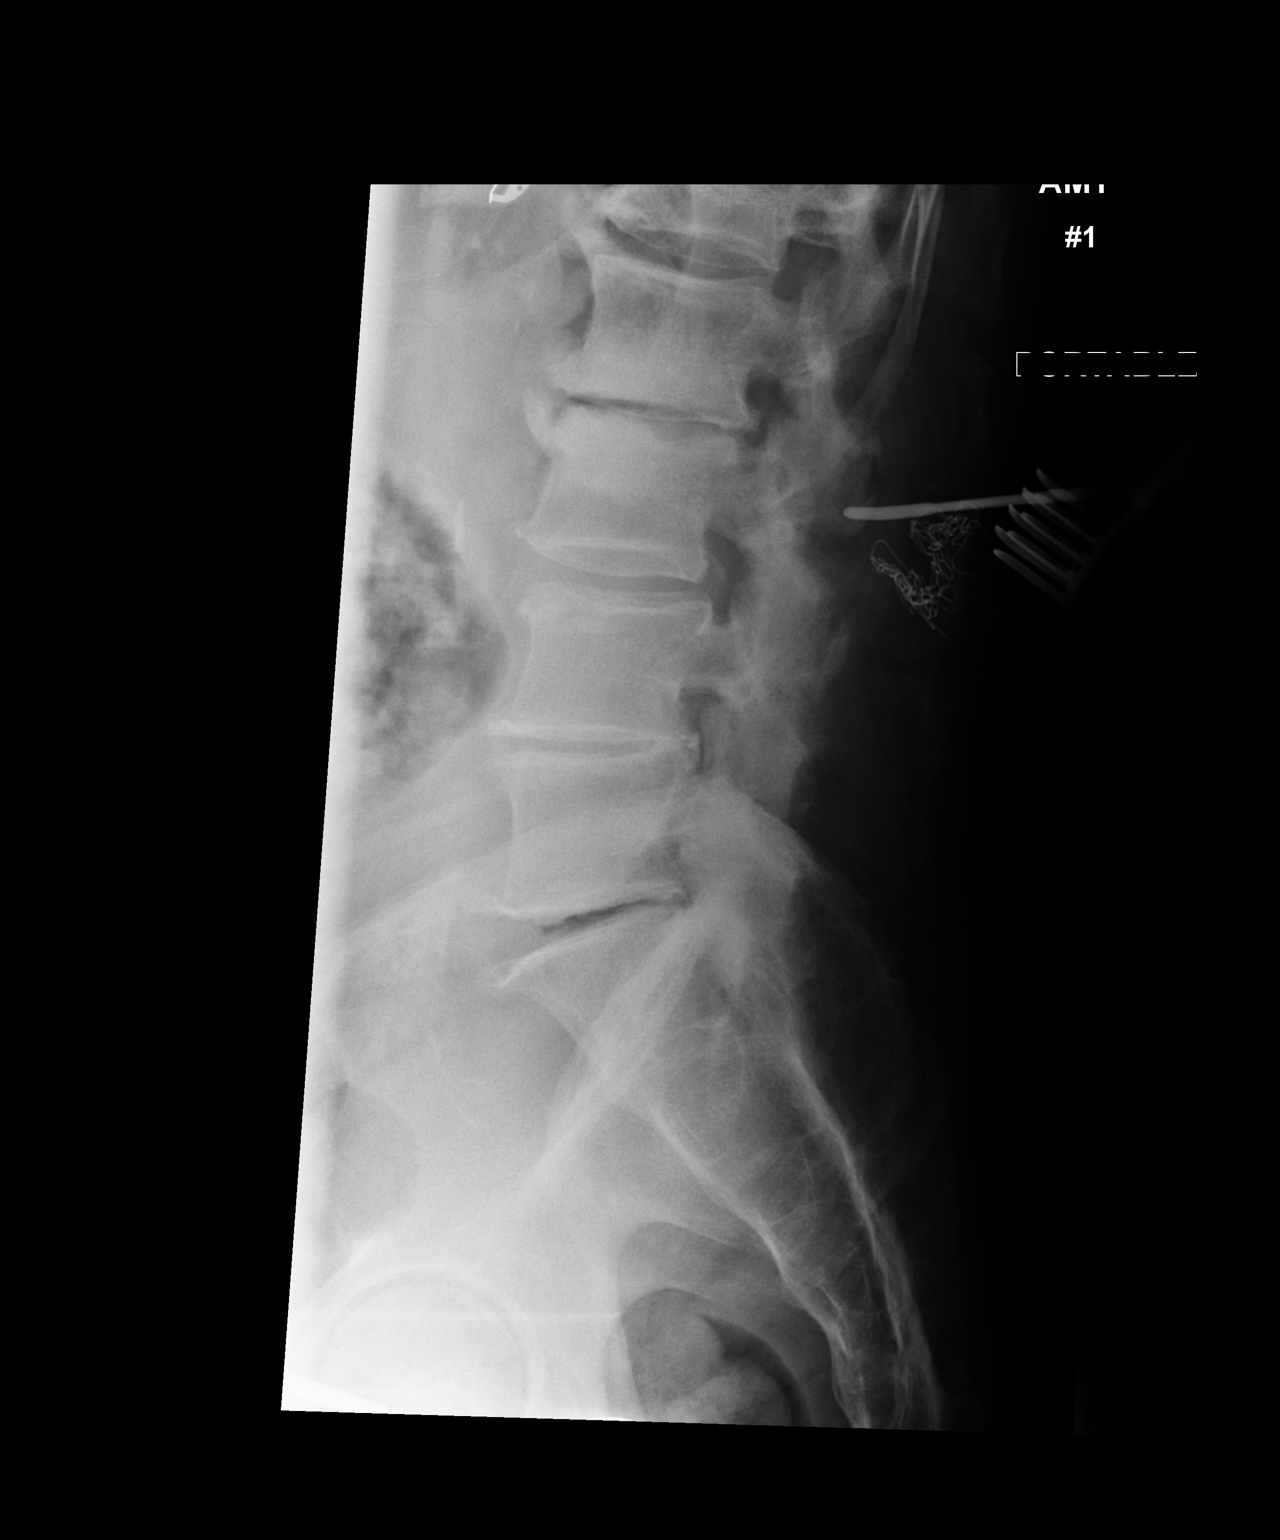

[1 of 1 positions shown; findings below may reference images not displayed]

FINDINGS: Single lateral view of the lumbar spine submitted. There is a
posterior localization instrument at the mid level of L3 vertebral
body. Disc space flattening with vacuum disc phenomenon at L5-S1
level. Disc space flattening with anterior spurring and vacuum disc
phenomenon at L2-L3 level.
IMPRESSION: Posterior localization instrument at the level of mid aspect of L3
vertebral body.

## 2018-01-10 ENCOUNTER — Other Ambulatory Visit: Payer: Self-pay | Admitting: Neurosurgery

## 2018-01-10 DIAGNOSIS — M4316 Spondylolisthesis, lumbar region: Secondary | ICD-10-CM

## 2018-02-03 DIAGNOSIS — G4733 Obstructive sleep apnea (adult) (pediatric): Secondary | ICD-10-CM | POA: Insufficient documentation

## 2018-04-18 ENCOUNTER — Ambulatory Visit: Payer: Medicare Other

## 2018-04-29 ENCOUNTER — Ambulatory Visit
Admission: RE | Admit: 2018-04-29 | Discharge: 2018-04-29 | Disposition: A | Discharge status: Home / Self Care | Payer: Medicare Other | Source: Ambulatory Visit | Attending: Neurosurgery | Admitting: Neurosurgery

## 2018-04-29 DIAGNOSIS — M4316 Spondylolisthesis, lumbar region: Secondary | ICD-10-CM | POA: Diagnosis present

## 2018-04-29 LAB — POCT I-STAT CREATININE: Creatinine, Ser: 0.9 mg/dL (ref 0.61–1.24)

## 2018-04-29 MED ORDER — GADOBUTROL 1 MMOL/ML IV SOLN
10.0000 mL | Freq: Once | INTRAVENOUS | Status: AC | PRN
  Administered 2018-04-29: 10 mL via INTRAVENOUS

## 2018-11-14 ENCOUNTER — Other Ambulatory Visit: Payer: Self-pay | Admitting: Neurosurgery

## 2018-11-14 DIAGNOSIS — M5432 Sciatica, left side: Secondary | ICD-10-CM

## 2018-11-17 ENCOUNTER — Telehealth: Payer: Self-pay | Admitting: Nurse Practitioner

## 2018-11-17 NOTE — Telephone Encounter (Signed)
Phone call to patient to verify medication list and allergies for myelogram procedure. Pt aware he will not need to hold any medications for this procedure. Pre and post procedure instructions reviewed with pt. 

## 2018-11-25 ENCOUNTER — Ambulatory Visit
Admission: RE | Admit: 2018-11-25 | Discharge: 2018-11-25 | Disposition: A | Discharge status: Home / Self Care | Payer: Medicare Other | Source: Ambulatory Visit | Attending: Neurosurgery | Admitting: Neurosurgery

## 2018-11-25 ENCOUNTER — Other Ambulatory Visit: Payer: Self-pay

## 2018-11-25 VITALS — BP 120/50 | HR 55

## 2018-11-25 DIAGNOSIS — Z6833 Body mass index (BMI) 33.0-33.9, adult: Secondary | ICD-10-CM | POA: Insufficient documentation

## 2018-11-25 DIAGNOSIS — E7800 Pure hypercholesterolemia, unspecified: Secondary | ICD-10-CM | POA: Insufficient documentation

## 2018-11-25 DIAGNOSIS — M5136 Other intervertebral disc degeneration, lumbar region: Secondary | ICD-10-CM | POA: Insufficient documentation

## 2018-11-25 DIAGNOSIS — N529 Male erectile dysfunction, unspecified: Secondary | ICD-10-CM | POA: Insufficient documentation

## 2018-11-25 DIAGNOSIS — E6609 Other obesity due to excess calories: Secondary | ICD-10-CM | POA: Insufficient documentation

## 2018-11-25 DIAGNOSIS — I10 Essential (primary) hypertension: Secondary | ICD-10-CM | POA: Insufficient documentation

## 2018-11-25 DIAGNOSIS — M5432 Sciatica, left side: Secondary | ICD-10-CM

## 2018-11-25 DIAGNOSIS — M4316 Spondylolisthesis, lumbar region: Secondary | ICD-10-CM

## 2018-11-25 MED ORDER — DIAZEPAM 5 MG PO TABS
5.0000 mg | ORAL_TABLET | Freq: Once | ORAL | Status: AC
  Administered 2018-11-25: 5 mg via ORAL

## 2018-11-25 MED ORDER — IOPAMIDOL (ISOVUE-M 200) INJECTION 41%
15.0000 mL | Freq: Once | INTRAMUSCULAR | Status: AC
  Administered 2018-11-25: 15 mL via INTRATHECAL

## 2018-11-25 NOTE — Discharge Instructions (Signed)

## 2019-09-29 ENCOUNTER — Other Ambulatory Visit: Payer: Self-pay | Admitting: Neurosurgery

## 2019-09-29 DIAGNOSIS — M542 Cervicalgia: Secondary | ICD-10-CM

## 2019-10-14 ENCOUNTER — Ambulatory Visit
Admission: RE | Admit: 2019-10-14 | Discharge: 2019-10-14 | Disposition: A | Discharge status: Home / Self Care | Payer: Medicare Other | Source: Ambulatory Visit | Attending: Neurosurgery | Admitting: Neurosurgery

## 2019-10-14 ENCOUNTER — Other Ambulatory Visit: Payer: Self-pay

## 2019-10-14 DIAGNOSIS — M542 Cervicalgia: Secondary | ICD-10-CM | POA: Diagnosis present

## 2020-01-11 ENCOUNTER — Other Ambulatory Visit: Payer: Self-pay

## 2020-01-11 ENCOUNTER — Encounter (INDEPENDENT_AMBULATORY_CARE_PROVIDER_SITE_OTHER): Payer: Self-pay | Admitting: Vascular Surgery

## 2020-01-11 ENCOUNTER — Ambulatory Visit (INDEPENDENT_AMBULATORY_CARE_PROVIDER_SITE_OTHER): Payer: Medicare Other | Admitting: Vascular Surgery

## 2020-01-11 VITALS — BP 151/58 | HR 60 | Resp 16 | Ht 70.0 in | Wt 232.0 lb

## 2020-01-11 DIAGNOSIS — I10 Essential (primary) hypertension: Secondary | ICD-10-CM | POA: Diagnosis not present

## 2020-01-11 DIAGNOSIS — M79604 Pain in right leg: Secondary | ICD-10-CM

## 2020-01-11 DIAGNOSIS — E7800 Pure hypercholesterolemia, unspecified: Secondary | ICD-10-CM | POA: Diagnosis not present

## 2020-01-11 DIAGNOSIS — M5136 Other intervertebral disc degeneration, lumbar region: Secondary | ICD-10-CM | POA: Diagnosis not present

## 2020-01-11 DIAGNOSIS — M79605 Pain in left leg: Secondary | ICD-10-CM

## 2020-01-15 ENCOUNTER — Encounter (INDEPENDENT_AMBULATORY_CARE_PROVIDER_SITE_OTHER): Payer: Self-pay | Admitting: Vascular Surgery

## 2020-01-15 NOTE — Progress Notes (Signed)
MRN : [id number]  Nathan Curtis is a 69 y.o. (10/05/1950) male who presents with chief complaint of  Chief Complaint  Patient presents with  . New Patient (Initial Visit)    PAD  .  History of Present Illness:   The patient is seen for evaluation of painful lower extremities. Patient notes the pain is variable and not always associated with activity.  The pain is somewhat consistent day to day occurring on most days. The patient notes the pain also occurs with standing and routinely seems worse as the day wears on. The pain has been progressive over the past several years. The patient states these symptoms are causing  a profound negative impact on quality of life and daily activities.  The patient denies rest pain or dangling of an extremity off the side of the bed during the night for relief. No open wounds or sores at this time. No history of DVT or phlebitis. No prior interventions or surgeries.  There is a  history of back problems and DJD of the lumbar and sacral spine.    Current Meds  Medication Sig  . aspirin EC 81 MG tablet Take 1 tablet (81 mg total) by mouth daily.  . cyclobenzaprine (FLEXERIL) 10 MG tablet Take 1 tablet (10 mg total) by mouth 3 (three) times daily as needed for muscle spasms.  Marland Kitchen docusate sodium (COLACE) 100 MG capsule Take 100 mg by mouth daily as needed for mild constipation.  . fluticasone (FLONASE) 50 MCG/ACT nasal spray Place 1-2 sprays into both nostrils daily as needed for allergies or rhinitis.  Marland Kitchen gabapentin (NEURONTIN) 300 MG capsule Take 300 mg by mouth 3 (three) times daily.  . hydrochlorothiazide (HYDRODIURIL) 25 MG tablet Take 25 mg by mouth daily.  Marland Kitchen ibuprofen (ADVIL) 200 MG tablet Take by mouth.  . Loratadine 10 MG CAPS Take by mouth.  . losartan (COZAAR) 50 MG tablet Take 50 mg by mouth daily.  . Multiple Vitamin (MULTI-VITAMIN) tablet Take by mouth.  . Multiple Vitamin (MULTIVITAMIN WITH MINERALS) TABS tablet Take 1 tablet by mouth  daily.  . simvastatin (ZOCOR) 20 MG tablet Take 20 mg by mouth at bedtime.  . tamsulosin (FLOMAX) 0.4 MG CAPS capsule TAKE 1 CAPSULE BY MOUTH  ONCE DAILY 30 MINUTES AFTER SAME MEAL EACH DAY.  Marland Kitchen triamcinolone cream (KENALOG) 0.1 % SMARTSIG:1 Application Topical 2-3 Times Daily    Past Medical History:  Diagnosis Date  . Arthritis    lumbar spondyolisthesis   . History of kidney stones 1970   passed spontaneously  . Hypertension     Past Surgical History:  Procedure Laterality Date  . HERNIA REPAIR Right 1969, 2010   inguinal     Social History Social History   Tobacco Use  . Smoking status: Never Smoker  . Smokeless tobacco: Never Used  Substance Use Topics  . Alcohol use: No  . Drug use: No    Family History No family history of bleeding/clotting disorders, porphyria or autoimmune disease   No Known Allergies   REVIEW OF SYSTEMS (Negative unless checked)  Constitutional: [] Weight loss  [] Fever  [] Chills Cardiac: [] Chest pain   [] Chest pressure   [] Palpitations   [] Shortness of breath when laying flat   [] Shortness of breath with exertion. Vascular:  [x] Pain in legs with walking   [x] Pain in legs at rest  [] History of DVT   [] Phlebitis   [] Swelling in legs   [] Varicose veins   [] Non-healing ulcers Pulmonary:   [] Uses  home oxygen   [] Productive cough   [] Hemoptysis   [] Wheeze  [] COPD   [] Asthma Neurologic:  [] Dizziness   [] Seizures   [] History of stroke   [] History of TIA  [] Aphasia   [] Vissual changes   [] Weakness or numbness in arm   [] Weakness or numbness in leg Musculoskeletal:   [] Joint swelling   [x] Joint pain   [x] Low back pain Hematologic:  [] Easy bruising  [] Easy bleeding   [] Hypercoagulable state   [] Anemic Gastrointestinal:  [] Diarrhea   [] Vomiting  [] Gastroesophageal reflux/heartburn   [] Difficulty swallowing. Genitourinary:  [] Chronic kidney disease   [] Difficult urination  [] Frequent urination   [] Blood in urine Skin:  [] Rashes   [] Ulcers  Psychological:   [] History of anxiety   []  History of major depression.  Physical Examination  Vitals:   01/11/20 0846  BP: (!) 151/58  Pulse: 60  Resp: 16  Weight: 232 lb (105.2 kg)  Height: 5\' 10"  (1.778 m)   Body mass index is 33.29 kg/m. Gen: WD/WN, NAD Head: [place]/AT, No temporalis wasting.  Ear/Nose/Throat: Hearing grossly intact, nares w/o erythema or drainage, poor dentition Eyes: PER, EOMI, sclera nonicteric.  Neck: Supple, no masses.  No bruit or JVD.  Pulmonary:  Good air movement, clear to auscultation bilaterally, no use of accessory muscles.  Cardiac: RRR, normal S1, S2, no Murmurs. Vascular:  Vessel Right Left  Radial Palpable Palpable  PT Palpable Palpable  DP Palpable Palpable  Gastrointestinal: soft, non-distended. No guarding/no peritoneal signs.  Musculoskeletal: M/S 5/5 throughout.  No deformity or atrophy.  Neurologic: CN 2-12 intact. Pain and light touch intact in extremities.  Symmetrical.  Speech is fluent. Motor exam as listed above. Psychiatric: Judgment intact, Mood & affect appropriate for pt's clinical situation. Dermatologic: No rashes or ulcers noted.  No changes consistent with cellulitis.   CBC Lab Results  Component Value Date   WBC 12.7 (H) 02/09/2016   HGB 11.8 (L) 02/09/2016   HCT 34.1 (L) 02/09/2016   MCV 93.4 02/09/2016   PLT 202 02/09/2016    BMET    Component Value Date/Time   NA 138 02/09/2016 0430   K 4.0 02/09/2016 0430   CL 100 (L) 02/09/2016 0430   CO2 29 02/09/2016 0430   GLUCOSE 134 (H) 02/09/2016 0430   BUN 14 02/09/2016 0430   CREATININE 0.90 04/29/2018 1701   CALCIUM 8.4 (L) 02/09/2016 0430   GFRNONAA >60 02/09/2016 0430   GFRAA >60 02/09/2016 0430   CrCl cannot be calculated (Patient's most recent lab result is older than the maximum 21 days allowed.).  COAG No results found for: INR, PROTIME  Radiology No results found.   Assessment/Plan 1. Pain in both lower extremities Recommend:  I do not find evidence of  Vascular pathology that would explain the patient's symptoms  The patient has atypical pain symptoms for vascular disease  I do not find evidence of Vascular pathology that would explain the patient's symptoms and I suspect the patient is c/o pseudoclaudication.  Patient should have an evaluation of his LS spine which I defer to the primary service.  Noninvasive studies including venous ultrasound of the legs do not identify vascular problems  The patient should continue walking and begin a more formal exercise program. The patient should continue his antiplatelet therapy and aggressive treatment of the lipid abnormalities. The patient should begin wearing graduated compression socks 15-20 mmHg strength to control her mild edema.  Patient will follow-up with me on a PRN basis  Further work-up of her lower  extremity pain is deferred to the primary service     2. DDD (degenerative disc disease), lumbar Recommend:  I do not find evidence of Vascular pathology that would explain the patient's symptoms  The patient has atypical pain symptoms for vascular disease  I do not find evidence of Vascular pathology that would explain the patient's symptoms and I suspect the patient is c/o pseudoclaudication.  Patient should have an evaluation of his LS spine which I defer to the primary service.  Noninvasive studies including venous ultrasound of the legs do not identify vascular problems  The patient should continue walking and begin a more formal exercise program. The patient should continue his antiplatelet therapy and aggressive treatment of the lipid abnormalities. The patient should begin wearing graduated compression socks 15-20 mmHg strength to control her mild edema.  Patient will follow-up with me on a PRN basis  Further work-up of her lower extremity pain is deferred to the primary service     3. Essential hypertension Continue antihypertensive medications as already ordered,  these medications have been reviewed and there are no changes at this time.   4. Pure hypercholesterolemia Continue statin as ordered and reviewed, no changes at this time     Levora Dredge, MD  01/15/2020 3:38 PM

## 2020-08-22 ENCOUNTER — Other Ambulatory Visit: Payer: Self-pay

## 2020-08-22 ENCOUNTER — Emergency Department: Payer: Medicare Other

## 2020-08-22 ENCOUNTER — Inpatient Hospital Stay
Admission: EM | Admit: 2020-08-22 | Discharge: 2020-08-27 | DRG: 871 | Disposition: A | Discharge status: Home / Self Care | Payer: Medicare Other | Attending: Internal Medicine | Admitting: Internal Medicine

## 2020-08-22 DIAGNOSIS — R6520 Severe sepsis without septic shock: Secondary | ICD-10-CM | POA: Diagnosis present

## 2020-08-22 DIAGNOSIS — R509 Fever, unspecified: Secondary | ICD-10-CM

## 2020-08-22 DIAGNOSIS — A419 Sepsis, unspecified organism: Secondary | ICD-10-CM | POA: Diagnosis not present

## 2020-08-22 DIAGNOSIS — Z20822 Contact with and (suspected) exposure to covid-19: Secondary | ICD-10-CM | POA: Diagnosis present

## 2020-08-22 DIAGNOSIS — Z79899 Other long term (current) drug therapy: Secondary | ICD-10-CM

## 2020-08-22 DIAGNOSIS — G4733 Obstructive sleep apnea (adult) (pediatric): Secondary | ICD-10-CM

## 2020-08-22 DIAGNOSIS — R350 Frequency of micturition: Secondary | ICD-10-CM | POA: Diagnosis present

## 2020-08-22 DIAGNOSIS — D6959 Other secondary thrombocytopenia: Secondary | ICD-10-CM | POA: Diagnosis present

## 2020-08-22 DIAGNOSIS — E871 Hypo-osmolality and hyponatremia: Secondary | ICD-10-CM

## 2020-08-22 DIAGNOSIS — E872 Acidosis, unspecified: Secondary | ICD-10-CM

## 2020-08-22 DIAGNOSIS — I10 Essential (primary) hypertension: Secondary | ICD-10-CM | POA: Diagnosis present

## 2020-08-22 DIAGNOSIS — F32A Depression, unspecified: Secondary | ICD-10-CM

## 2020-08-22 DIAGNOSIS — E878 Other disorders of electrolyte and fluid balance, not elsewhere classified: Secondary | ICD-10-CM | POA: Diagnosis present

## 2020-08-22 DIAGNOSIS — N12 Tubulo-interstitial nephritis, not specified as acute or chronic: Secondary | ICD-10-CM | POA: Diagnosis present

## 2020-08-22 DIAGNOSIS — N401 Enlarged prostate with lower urinary tract symptoms: Secondary | ICD-10-CM

## 2020-08-22 DIAGNOSIS — G8929 Other chronic pain: Secondary | ICD-10-CM | POA: Diagnosis present

## 2020-08-22 DIAGNOSIS — M1990 Unspecified osteoarthritis, unspecified site: Secondary | ICD-10-CM | POA: Diagnosis present

## 2020-08-22 DIAGNOSIS — Z6833 Body mass index (BMI) 33.0-33.9, adult: Secondary | ICD-10-CM

## 2020-08-22 DIAGNOSIS — D696 Thrombocytopenia, unspecified: Secondary | ICD-10-CM

## 2020-08-22 DIAGNOSIS — E8770 Fluid overload, unspecified: Secondary | ICD-10-CM | POA: Diagnosis present

## 2020-08-22 DIAGNOSIS — A4159 Other Gram-negative sepsis: Principal | ICD-10-CM

## 2020-08-22 DIAGNOSIS — E669 Obesity, unspecified: Secondary | ICD-10-CM | POA: Diagnosis present

## 2020-08-22 DIAGNOSIS — E785 Hyperlipidemia, unspecified: Secondary | ICD-10-CM | POA: Diagnosis present

## 2020-08-22 DIAGNOSIS — A4150 Gram-negative sepsis, unspecified: Secondary | ICD-10-CM | POA: Diagnosis present

## 2020-08-22 DIAGNOSIS — J9601 Acute respiratory failure with hypoxia: Secondary | ICD-10-CM

## 2020-08-22 DIAGNOSIS — E66811 Obesity, class 1: Secondary | ICD-10-CM

## 2020-08-22 DIAGNOSIS — N390 Urinary tract infection, site not specified: Secondary | ICD-10-CM

## 2020-08-22 LAB — CBC WITH DIFFERENTIAL/PLATELET
Abs Immature Granulocytes: 0.03 10*3/uL (ref 0.00–0.07)
Basophils Absolute: 0.1 10*3/uL (ref 0.0–0.1)
Basophils Relative: 1 %
Eosinophils Absolute: 0.1 10*3/uL (ref 0.0–0.5)
Eosinophils Relative: 1 %
HCT: 39.1 % (ref 39.0–52.0)
Hemoglobin: 13.8 g/dL (ref 13.0–17.0)
Immature Granulocytes: 0 %
Lymphocytes Relative: 4 %
Lymphs Abs: 0.5 10*3/uL — ABNORMAL LOW (ref 0.7–4.0)
MCH: 33.1 pg (ref 26.0–34.0)
MCHC: 35.3 g/dL (ref 30.0–36.0)
MCV: 93.8 fL (ref 80.0–100.0)
Monocytes Absolute: 0.2 10*3/uL (ref 0.1–1.0)
Monocytes Relative: 2 %
Neutro Abs: 10.6 10*3/uL — ABNORMAL HIGH (ref 1.7–7.7)
Neutrophils Relative %: 92 %
Platelets: 205 10*3/uL (ref 150–400)
RBC: 4.17 MIL/uL — ABNORMAL LOW (ref 4.22–5.81)
RDW: 12.3 % (ref 11.5–15.5)
WBC: 11.5 10*3/uL — ABNORMAL HIGH (ref 4.0–10.5)
nRBC: 0 % (ref 0.0–0.2)

## 2020-08-22 LAB — BASIC METABOLIC PANEL
Anion gap: 12 (ref 5–15)
BUN: 12 mg/dL (ref 8–23)
CO2: 24 mmol/L (ref 22–32)
Calcium: 8.7 mg/dL — ABNORMAL LOW (ref 8.9–10.3)
Chloride: 94 mmol/L — ABNORMAL LOW (ref 98–111)
Creatinine, Ser: 0.81 mg/dL (ref 0.61–1.24)
GFR, Estimated: >60 mL/min (ref >60)
Glucose, Bld: 119 mg/dL — ABNORMAL HIGH (ref 70–99)
Potassium: 3.7 mmol/L (ref 3.5–5.1)
Sodium: 130 mmol/L — ABNORMAL LOW (ref 135–145)

## 2020-08-22 LAB — LACTIC ACID, PLASMA: Lactic Acid, Venous: 2.9 mmol/L (ref 0.5–1.9)

## 2020-08-22 LAB — APTT: aPTT: 28 seconds (ref 24–36)

## 2020-08-22 LAB — URINALYSIS, COMPLETE (UACMP) WITH MICROSCOPIC
Bilirubin Urine: NEGATIVE
Glucose, UA: NEGATIVE mg/dL
Ketones, ur: 5 mg/dL — AB
Nitrite: NEGATIVE
Protein, ur: 100 mg/dL — AB
RBC / HPF: >50 RBC/hpf — ABNORMAL HIGH (ref 0–5)
Specific Gravity, Urine: 1.023 (ref 1.005–1.030)
Squamous Epithelial / HPF: NONE SEEN (ref 0–5)
WBC, UA: >50 WBC/hpf — ABNORMAL HIGH (ref 0–5)
pH: 5 (ref 5.0–8.0)

## 2020-08-22 LAB — HEPATIC FUNCTION PANEL
ALT: 29 U/L (ref 0–44)
AST: 33 U/L (ref 15–41)
Albumin: 4.6 g/dL (ref 3.5–5.0)
Alkaline Phosphatase: 53 U/L (ref 38–126)
Bilirubin, Direct: 0.2 mg/dL (ref 0.0–0.2)
Indirect Bilirubin: 0.9 mg/dL (ref 0.3–0.9)
Total Bilirubin: 1.1 mg/dL (ref 0.3–1.2)
Total Protein: 6.9 g/dL (ref 6.5–8.1)

## 2020-08-22 LAB — PROTIME-INR
INR: 1 (ref 0.8–1.2)
Prothrombin Time: 13.3 seconds (ref 11.4–15.2)

## 2020-08-22 MED ORDER — IBUPROFEN 600 MG PO TABS
600.0000 mg | ORAL_TABLET | Freq: Once | ORAL | Status: AC
  Administered 2020-08-22: 600 mg via ORAL
  Filled 2020-08-22: qty 1

## 2020-08-22 MED ORDER — LACTATED RINGERS IV BOLUS (SEPSIS)
500.0000 mL | Freq: Once | INTRAVENOUS | Status: AC
  Administered 2020-08-23: 500 mL via INTRAVENOUS

## 2020-08-22 MED ORDER — LACTATED RINGERS IV SOLN: INTRAVENOUS | Status: DC

## 2020-08-22 MED ORDER — LACTATED RINGERS IV BOLUS (SEPSIS)
1000.0000 mL | Freq: Once | INTRAVENOUS | Status: AC
  Administered 2020-08-23: 1000 mL via INTRAVENOUS

## 2020-08-22 MED ORDER — SODIUM CHLORIDE 0.9 % IV SOLN
1.0000 g | INTRAVENOUS | Status: DC
  Administered 2020-08-22: 1 g via INTRAVENOUS
  Filled 2020-08-22: qty 10

## 2020-08-22 MED ORDER — LACTATED RINGERS IV BOLUS
1000.0000 mL | Freq: Once | INTRAVENOUS | Status: AC
  Administered 2020-08-23: 1000 mL via INTRAVENOUS

## 2020-08-22 MED ORDER — LACTATED RINGERS IV BOLUS (SEPSIS)
1000.0000 mL | Freq: Once | INTRAVENOUS | Status: AC
  Administered 2020-08-22: 1000 mL via INTRAVENOUS

## 2020-08-22 NOTE — Sepsis Progress Note (Signed)
Elink Following for Code Sepsis  

## 2020-08-22 NOTE — ED Notes (Signed)
Lab reports lactic 2.9; acuity level changed; charge nurse notified and will take pt back to next available exam room

## 2020-08-22 NOTE — ED Provider Notes (Signed)
[facility] Emergency Department Provider Note  ____________________________________________  Time seen: Approximately 11:56 PM  I have reviewed the triage vital signs and the nursing notes.   HISTORY  Chief Complaint Urinary Frequency   HPI Nathan Curtis is a 70 y.o. male with a history of hypertension, hyperlipidemia, OSA, BPH who presents for evaluation of urinary frequency.  Patient reports that his symptoms started yesterday with dysuria and frequency.  Today he was incontinent a few times.  Started having shaking chills at home just prior to arrival.  Denies nausea, vomiting, abdominal pain, flank pain, chest pain, shortness of breath, fevers at home.  Denies any prior history of UTIs or urosepsis.   Past Medical History:  Diagnosis Date  . Arthritis    lumbar spondyolisthesis   . History of kidney stones 1970   passed spontaneously  . Hypertension     Patient Active Problem List   Diagnosis Date Noted  . Class 1 obesity due to excess calories with serious comorbidity and body mass index (BMI) of 33.0 to 33.9 in adult 11/25/2018  . DDD (degenerative disc disease), lumbar 11/25/2018  . ED (erectile dysfunction) 11/25/2018  . Essential hypertension 11/25/2018  . Pure hypercholesterolemia 11/25/2018  . OSA (obstructive sleep apnea) 02/03/2018  . Leg pain 06/26/2016  . Spondylolisthesis of lumbar region 02/08/2016  . Vaccine counseling 12/29/2015  . BPH with obstruction/lower urinary tract symptoms 06/27/2015    Past Surgical History:  Procedure Laterality Date  . HERNIA REPAIR Right 1969, 2010   inguinal     Prior to Admission medications   Medication Sig Start Date End Date Taking? Authorizing Provider  Alpha-Lipoic Acid 600 MG TABS Take 600 mg by mouth 2 (two) times daily.   Yes [provider]  DULoxetine (CYMBALTA) 20 MG capsule Take 20 mg by mouth daily. 08/09/20  Yes [provider]  gabapentin (NEURONTIN) 600 MG  tablet Take 300 mg by mouth 2 (two) times daily. 08/17/20  Yes [provider]  hydrochlorothiazide (HYDRODIURIL) 25 MG tablet Take 25 mg by mouth daily.   Yes [provider]  losartan (COZAAR) 100 MG tablet Take 1 tablet by mouth daily. 06/23/20  Yes [provider]  Multiple Vitamin (MULTIVITAMIN WITH MINERALS) TABS tablet Take 1 tablet by mouth daily.   Yes [provider]  simvastatin (ZOCOR) 20 MG tablet Take 20 mg by mouth at bedtime.   Yes [provider]  tamsulosin (FLOMAX) 0.4 MG CAPS capsule TAKE 1 CAPSULE BY MOUTH  ONCE DAILY 30 MINUTES AFTER SAME MEAL EACH DAY. 07/17/18  Yes [provider]  sertraline (ZOLOFT) 25 MG tablet Take 25 mg by mouth daily. Patient not taking: No sig reported 07/22/20   [provider]    Allergies Patient has no known allergies.  No family history on file.  Social History Social History   Tobacco Use  . Smoking status: Never Smoker  . Smokeless tobacco: Never Used  Substance Use Topics  . Alcohol use: No  . Drug use: No    Review of Systems  Constitutional: + fever, chills Eyes: Negative for visual changes. ENT: Negative for sore throat. Neck: No neck pain  Cardiovascular: Negative for chest pain. Respiratory: Negative for shortness of breath. Gastrointestinal: Negative for abdominal pain, vomiting or diarrhea. Genitourinary: + dysuria, frequency Musculoskeletal: Negative for back pain. Skin: Negative for rash. Neurological: Negative for headaches, weakness or numbness. Psych: No SI or HI  ____________________________________________   PHYSICAL EXAM:  VITAL SIGNS: ED  Triage Vitals  Enc Vitals Group     BP 08/22/20 2137 (!) 145/61     Pulse Rate 08/22/20 2135 (!) 107     Resp 08/22/20 2135 20     Temp 08/22/20 2135 (!) 101.1 F (38.4 C)     Temp src --      SpO2 08/22/20 2135 96 %     Weight 08/22/20 2136 235 lb (106.6 kg)     Height 08/22/20 2136 5\' 10"  (1.778  m)     Head Circumference --      Peak Flow --      Pain Score 08/22/20 2136 0     Pain Loc --      Pain Edu? --      Excl. in GC? --     Constitutional: Alert and oriented. Well appearing and in no apparent distress. HEENT:      Head: Normocephalic and atraumatic.         Eyes: Conjunctivae are normal. Sclera is non-icteric.       Mouth/Throat: Mucous membranes are moist.       Neck: Supple with no signs of meningismus. Cardiovascular: Tachycardic with regular rhythm Respiratory: Normal respiratory effort. Lungs are clear to auscultation bilaterally.  Gastrointestinal: Soft, non tender, and non distended with positive bowel sounds. No rebound or guarding. Genitourinary: No CVA tenderness. Musculoskeletal:  No edema, cyanosis, or erythema of extremities. Neurologic: Normal speech and language. Face is symmetric. Moving all extremities. No gross focal neurologic deficits are appreciated. Skin: Skin is warm, dry and intact. No rash noted. Psychiatric: Mood and affect are normal. Speech and behavior are normal.  ____________________________________________   LABS (all labs ordered are listed, but only abnormal results are displayed)  Labs Reviewed  CBC WITH DIFFERENTIAL/PLATELET - Abnormal; Notable for the following components:      Result Value   WBC 11.5 (*)    RBC 4.17 (*)    Neutro Abs 10.6 (*)    Lymphs Abs 0.5 (*)    All other components within normal limits  BASIC METABOLIC PANEL - Abnormal; Notable for the following components:   Sodium 130 (*)    Chloride 94 (*)    Glucose, Bld 119 (*)    Calcium 8.7 (*)    All other components within normal limits  LACTIC ACID, PLASMA - Abnormal; Notable for the following components:   Lactic Acid, Venous 2.9 (*)    All other components within normal limits  URINALYSIS, COMPLETE (UACMP) WITH MICROSCOPIC - Abnormal; Notable for the following components:   Color, Urine AMBER (*)    APPearance TURBID (*)    Hgb urine dipstick  MODERATE (*)    Ketones, ur 5 (*)    Protein, ur 100 (*)    Leukocytes,Ua MODERATE (*)    RBC / HPF >50 (*)    WBC, UA >50 (*)    Bacteria, UA MANY (*)    All other components within normal limits  URINE CULTURE  CULTURE, BLOOD (ROUTINE X 2)  CULTURE, BLOOD (ROUTINE X 2)  RESP PANEL BY RT-PCR (FLU A&B, COVID) ARPGX2  CULTURE, BLOOD (ROUTINE X 2)  CULTURE, BLOOD (ROUTINE X 2)  PROTIME-INR  APTT  HEPATIC FUNCTION PANEL  LACTIC ACID, PLASMA   ____________________________________________  EKG  ED ECG REPORT I, 2137, the attending physician, personally viewed and interpreted this ECG.  Sinus rhythm with a rate of 77, first-degree AV block, frequent PVCs, right bundle branch block, left axis deviation with no ST  elevation.  PVCs are new when compared to prior but bundle branch block is old ____________________________________________  RADIOLOGY  I have personally reviewed the images performed during this visit and I agree with the Radiologist's read.   Interpretation by Radiologist:  DG Chest Port 1 View  Result Date: 08/22/2020 CLINICAL DATA:  Fever.  Chills. EXAM: PORTABLE CHEST 1 VIEW COMPARISON:  None. FINDINGS: Mild cardiomegaly. Normal mediastinal contours. No acute airspace disease. No pulmonary edema, pleural effusion, or pneumothorax. No acute osseous abnormalities are seen. IMPRESSION: Mild cardiomegaly without acute pulmonary process. Electronically Signed   By: Narda Rutherford M.D.   On: 08/22/2020 23:38     ____________________________________________   PROCEDURES  Procedure(s) performed:yes .1-3 Lead EKG Interpretation Performed by: Nita Sickle, MD Authorized by: Nita Sickle, MD     Interpretation: non-specific     ECG rate assessment: normal     Rhythm: sinus rhythm     Ectopy: PVCs     Critical Care performed: yes  CRITICAL CARE Performed by: Nita Sickle  ?  Total critical care time: 30 min  Critical care  time was exclusive of separately billable procedures and treating other patients.  Critical care was necessary to treat or prevent imminent or life-threatening deterioration.  Critical care was time spent personally by me on the following activities: development of treatment plan with patient and/or surrogate as well as nursing, discussions with consultants, evaluation of patient's response to treatment, examination of patient, obtaining history from patient or surrogate, ordering and performing treatments and interventions, ordering and review of laboratory studies, ordering and review of radiographic studies, pulse oximetry and re-evaluation of patient's condition.  ____________________________________________   INITIAL IMPRESSION / ASSESSMENT AND PLAN / ED COURSE  70 y.o. male with a history of hypertension, hyperlipidemia, OSA, BPH who presents for evaluation of urinary frequency, dysuria and now fever and tachycardia.  Patient meets sepsis criteria on arrival.  Labs showing elevated lactic and leukocytosis with a UA that is positive for UTI.  Patient has no abdominal pain and no flank pain therefore low suspicion for an obstructing stone.  Sepsis protocol initiated patient given IV fluids, Rocephin.  Cultures are pending.  EKG with frequent PVCs.  Normal potassium.  Will give a dose of IV magnesium.  Discussed with the hospitalist for admission.       _____________________________________________ Please note:  Patient was evaluated in Emergency Department today for the symptoms described in the history of present illness. Patient was evaluated in the context of the global COVID-19 pandemic, which necessitated consideration that the patient might be at risk for infection with the SARS-CoV-2 virus that causes COVID-19. Institutional protocols and algorithms that pertain to the evaluation of patients at risk for COVID-19 are in a state of rapid change based on information released by regulatory  bodies including the CDC and federal and state organizations. These policies and algorithms were followed during the patient's care in the ED.  Some ED evaluations and interventions may be delayed as a result of limited staffing during the pandemic.    Controlled Substance Database was reviewed by me. ____________________________________________   FINAL CLINICAL IMPRESSION(S) / ED DIAGNOSES   Final diagnoses:  Sepsis due to urinary tract infection (HCC)      NEW MEDICATIONS STARTED DURING THIS VISIT:  ED Discharge Orders    None       Note:  This document was prepared using Dragon voice recognition software and may include unintentional dictation errors.    Don Perking, [place], MD 08/23/20  0003  

## 2020-08-22 NOTE — ED Triage Notes (Addendum)
Pt to ED via EMS, pt states he began to have urinary frequency with painful urination yesterday. Pt states that he began to have urinary incontinence today. Pt reports chills at home and "uncontroblable stretches" Pt reports inc weakness today and shaking. Pt had tylenol PTA

## 2020-08-23 DIAGNOSIS — E785 Hyperlipidemia, unspecified: Secondary | ICD-10-CM

## 2020-08-23 DIAGNOSIS — R350 Frequency of micturition: Secondary | ICD-10-CM | POA: Diagnosis present

## 2020-08-23 DIAGNOSIS — N400 Enlarged prostate without lower urinary tract symptoms: Secondary | ICD-10-CM | POA: Diagnosis not present

## 2020-08-23 DIAGNOSIS — Z20822 Contact with and (suspected) exposure to covid-19: Secondary | ICD-10-CM | POA: Diagnosis present

## 2020-08-23 DIAGNOSIS — A419 Sepsis, unspecified organism: Secondary | ICD-10-CM

## 2020-08-23 DIAGNOSIS — E871 Hypo-osmolality and hyponatremia: Secondary | ICD-10-CM | POA: Diagnosis present

## 2020-08-23 DIAGNOSIS — G4733 Obstructive sleep apnea (adult) (pediatric): Secondary | ICD-10-CM | POA: Diagnosis present

## 2020-08-23 DIAGNOSIS — R6520 Severe sepsis without septic shock: Secondary | ICD-10-CM | POA: Diagnosis present

## 2020-08-23 DIAGNOSIS — E669 Obesity, unspecified: Secondary | ICD-10-CM | POA: Diagnosis present

## 2020-08-23 DIAGNOSIS — D696 Thrombocytopenia, unspecified: Secondary | ICD-10-CM | POA: Diagnosis not present

## 2020-08-23 DIAGNOSIS — E872 Acidosis: Secondary | ICD-10-CM | POA: Diagnosis present

## 2020-08-23 DIAGNOSIS — A4150 Gram-negative sepsis, unspecified: Secondary | ICD-10-CM | POA: Diagnosis present

## 2020-08-23 DIAGNOSIS — J9601 Acute respiratory failure with hypoxia: Secondary | ICD-10-CM | POA: Diagnosis present

## 2020-08-23 DIAGNOSIS — D6959 Other secondary thrombocytopenia: Secondary | ICD-10-CM | POA: Diagnosis present

## 2020-08-23 DIAGNOSIS — N390 Urinary tract infection, site not specified: Secondary | ICD-10-CM

## 2020-08-23 DIAGNOSIS — E878 Other disorders of electrolyte and fluid balance, not elsewhere classified: Secondary | ICD-10-CM | POA: Diagnosis present

## 2020-08-23 DIAGNOSIS — M1990 Unspecified osteoarthritis, unspecified site: Secondary | ICD-10-CM | POA: Diagnosis present

## 2020-08-23 DIAGNOSIS — A4159 Other Gram-negative sepsis: Secondary | ICD-10-CM | POA: Diagnosis present

## 2020-08-23 DIAGNOSIS — Z79899 Other long term (current) drug therapy: Secondary | ICD-10-CM | POA: Diagnosis not present

## 2020-08-23 DIAGNOSIS — Z6833 Body mass index (BMI) 33.0-33.9, adult: Secondary | ICD-10-CM | POA: Diagnosis not present

## 2020-08-23 DIAGNOSIS — I10 Essential (primary) hypertension: Secondary | ICD-10-CM | POA: Diagnosis present

## 2020-08-23 DIAGNOSIS — E8770 Fluid overload, unspecified: Secondary | ICD-10-CM | POA: Diagnosis present

## 2020-08-23 DIAGNOSIS — G8929 Other chronic pain: Secondary | ICD-10-CM | POA: Diagnosis present

## 2020-08-23 DIAGNOSIS — N12 Tubulo-interstitial nephritis, not specified as acute or chronic: Secondary | ICD-10-CM | POA: Diagnosis present

## 2020-08-23 DIAGNOSIS — F32A Depression, unspecified: Secondary | ICD-10-CM | POA: Diagnosis present

## 2020-08-23 DIAGNOSIS — N401 Enlarged prostate with lower urinary tract symptoms: Secondary | ICD-10-CM | POA: Diagnosis present

## 2020-08-23 LAB — LACTIC ACID, PLASMA
Lactic Acid, Venous: 2.4 mmol/L (ref 0.5–1.9)
Lactic Acid, Venous: 1.7 mmol/L (ref 0.5–1.9)

## 2020-08-23 LAB — BASIC METABOLIC PANEL
Anion gap: 9 (ref 5–15)
BUN: 14 mg/dL (ref 8–23)
CO2: 24 mmol/L (ref 22–32)
Calcium: 8.3 mg/dL — ABNORMAL LOW (ref 8.9–10.3)
Chloride: 98 mmol/L (ref 98–111)
Creatinine, Ser: 0.96 mg/dL (ref 0.61–1.24)
GFR, Estimated: >60 mL/min (ref >60)
Glucose, Bld: 122 mg/dL — ABNORMAL HIGH (ref 70–99)
Potassium: 3.5 mmol/L (ref 3.5–5.1)
Sodium: 131 mmol/L — ABNORMAL LOW (ref 135–145)

## 2020-08-23 LAB — CBC
HCT: 33.7 % — ABNORMAL LOW (ref 39.0–52.0)
Hemoglobin: 12.2 g/dL — ABNORMAL LOW (ref 13.0–17.0)
MCH: 33.9 pg (ref 26.0–34.0)
MCHC: 36.2 g/dL — ABNORMAL HIGH (ref 30.0–36.0)
MCV: 93.6 fL (ref 80.0–100.0)
Platelets: 174 10*3/uL (ref 150–400)
RBC: 3.6 MIL/uL — ABNORMAL LOW (ref 4.22–5.81)
RDW: 12.4 % (ref 11.5–15.5)
WBC: 15.9 10*3/uL — ABNORMAL HIGH (ref 4.0–10.5)
nRBC: 0 % (ref 0.0–0.2)

## 2020-08-23 LAB — RESP PANEL BY RT-PCR (FLU A&B, COVID) ARPGX2
Influenza A by PCR: NEGATIVE
Influenza B by PCR: NEGATIVE
SARS Coronavirus 2 by RT PCR: NEGATIVE

## 2020-08-23 LAB — PROTIME-INR
INR: 1.1 (ref 0.8–1.2)
Prothrombin Time: 14.2 seconds (ref 11.4–15.2)

## 2020-08-23 LAB — HIV ANTIBODY (ROUTINE TESTING W REFLEX): HIV Screen 4th Generation wRfx: NONREACTIVE

## 2020-08-23 LAB — CORTISOL-AM, BLOOD: Cortisol - AM: 12.4 ug/dL (ref 6.7–22.6)

## 2020-08-23 LAB — PROCALCITONIN: Procalcitonin: 2.97 ng/mL

## 2020-08-23 LAB — MAGNESIUM: Magnesium: 1.7 mg/dL (ref 1.7–2.4)

## 2020-08-23 MED ORDER — SODIUM CHLORIDE 0.9 % IV SOLN: 1.0000 g | INTRAVENOUS | Status: DC

## 2020-08-23 MED ORDER — ONDANSETRON HCL 4 MG/2ML IJ SOLN
4.0000 mg | Freq: Four times a day (QID) | INTRAMUSCULAR | Status: DC | PRN
  Administered 2020-08-24: 4 mg via INTRAVENOUS
  Filled 2020-08-23: qty 2

## 2020-08-23 MED ORDER — TRAZODONE HCL 50 MG PO TABS: 25.0000 mg | ORAL_TABLET | Freq: Every evening | ORAL | Status: DC | PRN

## 2020-08-23 MED ORDER — GABAPENTIN 600 MG PO TABS
300.0000 mg | ORAL_TABLET | Freq: Two times a day (BID) | ORAL | Status: DC
  Administered 2020-08-23 – 2020-08-27 (×9): 300 mg via ORAL
  Filled 2020-08-23 (×9): qty 1

## 2020-08-23 MED ORDER — VITAMIN D 25 MCG (1000 UNIT) PO TABS
1000.0000 [IU] | ORAL_TABLET | Freq: Every day | ORAL | Status: DC
  Administered 2020-08-23 – 2020-08-27 (×5): 1000 [IU] via ORAL
  Filled 2020-08-23 (×5): qty 1

## 2020-08-23 MED ORDER — SODIUM CHLORIDE 0.9 % IV SOLN: INTRAVENOUS | Status: DC

## 2020-08-23 MED ORDER — TAMSULOSIN HCL 0.4 MG PO CAPS
0.4000 mg | ORAL_CAPSULE | Freq: Every day | ORAL | Status: DC
  Administered 2020-08-23 – 2020-08-24 (×2): 0.4 mg via ORAL
  Filled 2020-08-23 (×2): qty 1

## 2020-08-23 MED ORDER — SIMVASTATIN 10 MG PO TABS
20.0000 mg | ORAL_TABLET | Freq: Every day | ORAL | Status: DC
  Administered 2020-08-23 – 2020-08-26 (×4): 20 mg via ORAL
  Filled 2020-08-23 (×6): qty 2

## 2020-08-23 MED ORDER — ENOXAPARIN SODIUM 60 MG/0.6ML IJ SOSY
0.5000 mg/kg | PREFILLED_SYRINGE | INTRAMUSCULAR | Status: DC
  Administered 2020-08-23 – 2020-08-27 (×5): 52.5 mg via SUBCUTANEOUS
  Filled 2020-08-23 (×5): qty 0.6

## 2020-08-23 MED ORDER — MAGNESIUM SULFATE 2 GM/50ML IV SOLN
2.0000 g | Freq: Once | INTRAVENOUS | Status: AC
  Administered 2020-08-23: 2 g via INTRAVENOUS
  Filled 2020-08-23: qty 50

## 2020-08-23 MED ORDER — ZINC SULFATE 220 (50 ZN) MG PO CAPS
220.0000 mg | ORAL_CAPSULE | Freq: Every day | ORAL | Status: DC
  Administered 2020-08-24 – 2020-08-27 (×4): 220 mg via ORAL
  Filled 2020-08-23 (×6): qty 1

## 2020-08-23 MED ORDER — ACETAMINOPHEN 650 MG RE SUPP: 650.0000 mg | Freq: Four times a day (QID) | RECTAL | Status: DC | PRN

## 2020-08-23 MED ORDER — LOSARTAN POTASSIUM 50 MG PO TABS
100.0000 mg | ORAL_TABLET | Freq: Every day | ORAL | Status: DC
  Filled 2020-08-23: qty 2

## 2020-08-23 MED ORDER — DULOXETINE HCL 20 MG PO CPEP
20.0000 mg | ORAL_CAPSULE | Freq: Every day | ORAL | Status: DC
  Administered 2020-08-23 – 2020-08-24 (×2): 20 mg via ORAL
  Filled 2020-08-23 (×2): qty 1

## 2020-08-23 MED ORDER — SODIUM CHLORIDE 0.9 % IV SOLN
2.0000 g | INTRAVENOUS | Status: DC
  Administered 2020-08-23 – 2020-08-24 (×2): 2 g via INTRAVENOUS
  Filled 2020-08-23 (×2): qty 2
  Filled 2020-08-23 (×2): qty 20

## 2020-08-23 MED ORDER — ACETAMINOPHEN 325 MG PO TABS
650.0000 mg | ORAL_TABLET | Freq: Four times a day (QID) | ORAL | Status: DC | PRN
  Administered 2020-08-23 – 2020-08-25 (×6): 650 mg via ORAL
  Filled 2020-08-23 (×6): qty 2

## 2020-08-23 MED ORDER — MAGNESIUM HYDROXIDE 400 MG/5ML PO SUSP
30.0000 mL | Freq: Every day | ORAL | Status: DC | PRN
  Filled 2020-08-23: qty 30

## 2020-08-23 MED ORDER — HYDROCHLOROTHIAZIDE 25 MG PO TABS: 25.0000 mg | ORAL_TABLET | Freq: Every day | ORAL | Status: DC

## 2020-08-23 MED ORDER — ADULT MULTIVITAMIN W/MINERALS CH
1.0000 | ORAL_TABLET | Freq: Every day | ORAL | Status: DC
  Administered 2020-08-23 – 2020-08-27 (×5): 1 via ORAL
  Filled 2020-08-23 (×5): qty 1

## 2020-08-23 MED ORDER — ALPHA-LIPOIC ACID 600 MG PO TABS: 600.0000 mg | ORAL_TABLET | Freq: Two times a day (BID) | ORAL | Status: DC

## 2020-08-23 MED ORDER — ONDANSETRON HCL 4 MG PO TABS: 4.0000 mg | ORAL_TABLET | Freq: Four times a day (QID) | ORAL | Status: DC | PRN

## 2020-08-23 NOTE — Progress Notes (Signed)
CODE SEPSIS - PHARMACY COMMUNICATION  **Broad Spectrum Antibiotics should be administered within 1 hour of Sepsis diagnosis**  Time Code Sepsis Called/Page Received:  5/24 @ 2308   Antibiotics Ordered: Ceftriaxone 1 gm IV X 1   Time of 1st antibiotic administration:  5/24 @ 2348   Additional action taken by pharmacy:   If necessary, Name of Provider/Nurse Contacted:     Alixandria Friedt D ,PharmD Clinical Pharmacist  08/23/2020  1:51 AM

## 2020-08-23 NOTE — Progress Notes (Signed)
PROGRESS NOTE   HPI was taken from Dr. Arville Care: Nathan Curtis is a 70 y.o. Caucasian male with medical history significant for BPH, hypertension, obstructive sleep apnea and dyslipidemia, who presented to the emergency room with acute onset of urinary frequency with associated dysuria and urgency.  He has been incontinent today for a few times.  He admitted to tactile fever and shaking chills.  No nausea or vomiting or flank pain.  He has been having occasional upper back pain. he denies any abdominal pain or diarrhea or melena.  No chest pain or cough or dyspnea.  ED Course: Upon presentation to the ER temperature was 101.1 with a heart rate of 107 and otherwise normal vital signs.  UA was positive for UTI.  Blood and urine cultures were drawn.  CBC showed leukocytosis of 11.5 with neutrophilia.  Lactic acid was 2.9.  CMP was remarkable for hyponatremia and hypochloremia. EKG as reviewed by me : showed normal sinus rhythm with a rate of 77 with ventricular bigeminy, right bundle branch block and left anterior fascicular block with probable LVH Imaging: *Chest x-ray showed mild cardiomegaly with no acute cardiopulmonary disease.  The patient was given a gram of IV Rocephin, 400 mg of p.o. ibuprofen, 59ml /kg V lactated ringer and 2 g of IV magnesium sulfate.  He will be admitted to a medical bed for further evaluation and management.   Nathan Curtis  QVZ:[id number] DOB: Jul 23, 1950 DOA: 08/22/2020 PCP: Marisue Ivan, MD    Assessment & Plan:   Active Problems:   Sepsis due to gram-negative UTI (HCC)  Severe sepsis: meets criteria w/ fever, tachycardia, elevated lactic acid & likely UTI. Blood cx NGTD. Urine cx is pending. Continue on IV ceftriaxone. Continue on IVFs  Possible UTI: UA is positive. Urine cx is pending. Continue on IV ceftriaxone   Leukocytosis: secondary to above infection. Continue on IV abxs  Hyponatremia: continue on IVFs. Trending up   Lactic acidosis:  resolved  HTN: continue on losartan. Will hold home dose of HCTZ  Depression: severity unknown. Continue on home dose of duloxetine  HLD: continue on statin   BPH: continue on home dose of tamsulosin   Obese: BMI 33.7. Complicates overall care and prognosis   DVT prophylaxis: lovenox  Code Status: full  Family Communication: Disposition Plan: likely d/c back home   Level of care: Med-Surg   Status is: Inpatient  Remains inpatient appropriate because:IV treatments appropriate due to intensity of illness or inability to take PO and Inpatient level of care appropriate due to severity of illness   Dispo: The patient is from: Home              Anticipated d/c is to: Home              Patient currently is not medically stable to d/c.   Difficult to place patient: unclear    Consultants:      Procedures:   Antimicrobials: ceftriaxone    Subjective: Pt c/o fatigue   Objective: Vitals:   08/22/20 2356 08/23/20 0000 08/23/20 0216 08/23/20 0539  BP: (!) 110/55 102/70 (!) 117/50 (!) 107/54  Pulse: 88 89 73 67  Resp: 18 20 18 18   Temp: 100 F (37.8 C)  98 F (36.7 C) 97.8 F (36.6 C)  TempSrc: Oral  Oral Oral  SpO2: 95% 93% 98% 95%  Weight:      Height:        Intake/Output Summary (Last 24 hours) at 08/23/2020 0757 Last data  filed at 08/23/2020 0131 Gross per 24 hour  Intake 150 ml  Output --  Net 150 ml   Filed Weights   08/22/20 2136  Weight: 106.6 kg    Examination:  General exam: Appears calm and comfortable  Respiratory system: Clear to auscultation. Respiratory effort normal. Cardiovascular system: S1 & S2 +. No  rubs, gallops or clicks.  Gastrointestinal system: Abdomen is obese, soft and nontender.Normal bowel sounds heard. Central nervous system: Alert and oriented. Moves all extremities  Psychiatry: Judgement and insight appear normal. Mood & affect appropriate.     Data Reviewed: I have personally reviewed following labs and imaging  studies  CBC: Recent Labs  Lab 08/22/20 2142 08/23/20 0303  WBC 11.5* 15.9*  NEUTROABS 10.6*  --   HGB 13.8 12.2*  HCT 39.1 33.7*  MCV 93.8 93.6  PLT 205 174   Basic Metabolic Panel: Recent Labs  Lab 08/22/20 2142 08/22/20 2350 08/23/20 0303  NA 130*  --  131*  K 3.7  --  3.5  CL 94*  --  98  CO2 24  --  24  GLUCOSE 119*  --  122*  BUN 12  --  14  CREATININE 0.81  --  0.96  CALCIUM 8.7*  --  8.3*  MG  --  1.7  --    GFR: Estimated Creatinine Clearance: 87.5 mL/min (by C-G formula based on SCr of 0.96 mg/dL). Liver Function Tests: Recent Labs  Lab 08/22/20 2142  AST 33  ALT 29  ALKPHOS 53  BILITOT 1.1  PROT 6.9  ALBUMIN 4.6   No results for input(s): LIPASE, AMYLASE in the last 168 hours. No results for input(s): AMMONIA in the last 168 hours. Coagulation Profile: Recent Labs  Lab 08/22/20 2329 08/23/20 0303  INR 1.0 1.1   Cardiac Enzymes: No results for input(s): CKTOTAL, CKMB, CKMBINDEX, TROPONINI in the last 168 hours. BNP (last 3 results) No results for input(s): PROBNP in the last 8760 hours. HbA1C: No results for input(s): HGBA1C in the last 72 hours. CBG: No results for input(s): GLUCAP in the last 168 hours. Lipid Profile: No results for input(s): CHOL, HDL, LDLCALC, TRIG, CHOLHDL, LDLDIRECT in the last 72 hours. Thyroid Function Tests: No results for input(s): TSH, T4TOTAL, FREET4, T3FREE, THYROIDAB in the last 72 hours. Anemia Panel: No results for input(s): VITAMINB12, FOLATE, FERRITIN, TIBC, IRON, RETICCTPCT in the last 72 hours. Sepsis Labs: Recent Labs  Lab 08/22/20 2149 08/23/20 0303 08/23/20 0615  PROCALCITON  --  2.97  --   LATICACIDVEN 2.9* 2.4* 1.7    Recent Results (from the past 240 hour(s))  Resp Panel by RT-PCR (Flu A&B, Covid)     Status: None   Collection Time: 08/22/20 11:29 PM   Specimen: Nasopharyngeal(NP) swabs in vial transport medium  Result Value Ref Range Status   SARS Coronavirus 2 by RT PCR NEGATIVE  NEGATIVE Final    Comment: (NOTE) SARS-CoV-2 target nucleic acids are NOT DETECTED.  The SARS-CoV-2 RNA is generally detectable in upper respiratory specimens during the acute phase of infection. The lowest concentration of SARS-CoV-2 viral copies this assay can detect is 138 copies/mL. A negative result does not preclude SARS-Cov-2 infection and should not be used as the sole basis for treatment or other patient management decisions. A negative result may occur with  improper specimen collection/handling, submission of specimen other than nasopharyngeal swab, presence of viral mutation(s) within the areas targeted by this assay, and inadequate number of viral copies(<138 copies/mL). A negative  result must be combined with clinical observations, patient history, and epidemiological information. The expected result is Negative.  Fact Sheet for Patients:  BloggerCourse.com  Fact Sheet for Healthcare Providers:  SeriousBroker.it  This test is no t yet approved or cleared by the [place] FDA and  has been authorized for detection and/or diagnosis of SARS-CoV-2 by FDA under an Emergency Use Authorization (EUA). This EUA will remain  in effect (meaning this test can be used) for the duration of the COVID-19 declaration under Section 564(b)(1) of the Act, 21 U.S.C.section 360bbb-3(b)(1), unless the authorization is terminated  or revoked sooner.       Influenza A by PCR NEGATIVE NEGATIVE Final   Influenza B by PCR NEGATIVE NEGATIVE Final    Comment: (NOTE) The Xpert Xpress SARS-CoV-2/FLU/RSV plus assay is intended as an aid in the diagnosis of influenza from Nasopharyngeal swab specimens and should not be used as a sole basis for treatment. Nasal washings and aspirates are unacceptable for Xpert Xpress SARS-CoV-2/FLU/RSV testing.  Fact Sheet for Patients: BloggerCourse.com  Fact Sheet for Healthcare  Providers: SeriousBroker.it  This test is not yet approved or cleared by the [place] FDA and has been authorized for detection and/or diagnosis of SARS-CoV-2 by FDA under an Emergency Use Authorization (EUA). This EUA will remain in effect (meaning this test can be used) for the duration of the COVID-19 declaration under Section 564(b)(1) of the Act, 21 U.S.C. section 360bbb-3(b)(1), unless the authorization is terminated or revoked.  Performed at [facility], [address] [place], [place] [address]          Radiology Studies: [facility] Chest [place] 1 View  Result Date: 08/22/2020 CLINICAL DATA:  Fever.  Chills. EXAM: PORTABLE CHEST 1 VIEW COMPARISON:  None. FINDINGS: Mild cardiomegaly. Normal mediastinal contours. No acute airspace disease. No pulmonary edema, pleural effusion, or pneumothorax. No acute osseous abnormalities are seen. IMPRESSION: Mild cardiomegaly without acute pulmonary process. Electronically Signed   By: Narda Rutherford M.D.   On: 08/22/2020 23:38        Scheduled Meds: . DULoxetine  20 mg Oral Daily  . enoxaparin (LOVENOX) injection  0.5 mg/kg Subcutaneous Q24H  . gabapentin  300 mg Oral BID  . hydrochlorothiazide  25 mg Oral Daily  . losartan  100 mg Oral Daily  . multivitamin with minerals  1 tablet Oral Daily  . simvastatin  20 mg Oral QHS  . tamsulosin  0.4 mg Oral Daily   Continuous Infusions: . sodium chloride 100 mL/hr at 08/23/20 0537  . [START ON 08/24/2020] cefTRIAXone (ROCEPHIN)  IV       LOS: 0 days    Time spent: 33 mins    Charise Killian, MD [facility] Pager [phone]  If 7PM-7AM, please contact night-coverage 08/23/2020, 7:57 AM

## 2020-08-23 NOTE — Progress Notes (Signed)
PHARMACIST - PHYSICIAN ORDER COMMUNICATION  CONCERNING: P&T Medication Policy on Herbal Medications  DESCRIPTION:  This patient's order for:  Alpha Lipoic Acid  has been noted.  This product(s) is classified as an "herbal" or natural product. Due to a lack of definitive safety studies or FDA approval, nonstandard manufacturing practices, plus the potential risk of unknown drug-drug interactions while on inpatient medications, the Pharmacy and Therapeutics Committee does not permit the use of "herbal" or natural products of this type within [facility].   ACTION TAKEN: The pharmacy department is unable to verify this order at this time and your patient has been informed of this safety policy. Please reevaluate patient's clinical condition at discharge and address if the herbal or natural product(s) should be resumed at that time.   

## 2020-08-23 NOTE — Progress Notes (Signed)
Anticoagulation monitoring(Lovenox):  70 yo male ordered Lovenox 40 mg Q24h  Filed Weights   08/22/20 2136  Weight: 106.6 kg (235 lb)   BMI 33.72    Lab Results  Component Value Date   CREATININE 0.81 08/22/2020   CREATININE 0.90 04/29/2018   CREATININE 0.95 02/09/2016   Estimated Creatinine Clearance: 103.7 mL/min (by C-G formula based on SCr of 0.81 mg/dL). Hemoglobin & Hematocrit     Component Value Date/Time   HGB 13.8 08/22/2020 2142   HCT 39.1 08/22/2020 2142     Per Protocol for Patient with estCrcl > 30 ml/min and BMI > 30, will transition to Lovenox 52.5 mg Q24h.

## 2020-08-23 NOTE — H&P (Signed)
[facility]   PATIENT NAME: Nathan Curtis    MR#:  [id number]  DATE OF BIRTH:  Aug 09, 1950  DATE OF ADMISSION:  08/22/2020  PRIMARY CARE PHYSICIAN: Marisue Ivan, MD   Patient is coming from: Home  REQUESTING/REFERRING PHYSICIAN: Maylon Cos, MD  CHIEF COMPLAINT:   Chief Complaint  Patient presents with  . Urinary Frequency    HISTORY OF PRESENT ILLNESS:  Nathan Curtis is a 70 y.o. Caucasian male with medical history significant for BPH, hypertension, obstructive sleep apnea and dyslipidemia, who presented to the emergency room with acute onset of urinary frequency with associated dysuria and urgency.  He has been incontinent today for a few times.  He admitted to tactile fever and shaking chills.  No nausea or vomiting or flank pain.  He has been having occasional upper back pain. he denies any abdominal pain or diarrhea or melena.  No chest pain or cough or dyspnea.  ED Course: Upon presentation to the ER temperature was 101.1 with a heart rate of 107 and otherwise normal vital signs.  UA was positive for UTI.  Blood and urine cultures were drawn.  CBC showed leukocytosis of 11.5 with neutrophilia.  Lactic acid was 2.9.  CMP was remarkable for hyponatremia and hypochloremia. EKG as reviewed by me : showed normal sinus rhythm with a rate of 77 with ventricular bigeminy, right bundle branch block and left anterior fascicular block with probable LVH Imaging: *Chest x-ray showed mild cardiomegaly with no acute cardiopulmonary disease.  The patient was given a gram of IV Rocephin, 400 mg of p.o. ibuprofen, 39ml /kg V lactated ringer and 2 g of IV magnesium sulfate.  He will be admitted to a medical bed for further evaluation and management. PAST MEDICAL HISTORY:   Past Medical History:  Diagnosis Date  . Arthritis    lumbar spondyolisthesis   . History of kidney stones 1970   passed spontaneously  . Hypertension   BPH Dyslipidemia Obstructive sleep  apnea  PAST SURGICAL HISTORY:   Past Surgical History:  Procedure Laterality Date  . HERNIA REPAIR Right 1969, 2010   inguinal     SOCIAL HISTORY:   Social History   Tobacco Use  . Smoking status: Never Smoker  . Smokeless tobacco: Never Used  Substance Use Topics  . Alcohol use: No    FAMILY HISTORY:  No family history on file.  DRUG ALLERGIES:  No Known Allergies  REVIEW OF SYSTEMS:   ROS As per history of present illness. All pertinent systems were reviewed above. Constitutional, HEENT, cardiovascular, respiratory, GI, GU, musculoskeletal, neuro, psychiatric, endocrine, integumentary and hematologic systems were reviewed and are otherwise negative/unremarkable except for positive findings mentioned above in the HPI.   MEDICATIONS AT HOME:   Prior to Admission medications   Medication Sig Start Date End Date Taking? Authorizing Provider  Alpha-Lipoic Acid 600 MG TABS Take 600 mg by mouth 2 (two) times daily.   Yes [provider]  DULoxetine (CYMBALTA) 20 MG capsule Take 20 mg by mouth daily. 08/09/20  Yes [provider]  gabapentin (NEURONTIN) 600 MG tablet Take 300 mg by mouth 2 (two) times daily. 08/17/20  Yes [provider]  hydrochlorothiazide (HYDRODIURIL) 25 MG tablet Take 25 mg by mouth daily.   Yes [provider]  losartan (COZAAR) 100 MG tablet Take 1 tablet by mouth daily. 06/23/20  Yes [provider]  Multiple Vitamin (MULTIVITAMIN WITH MINERALS) TABS tablet Take 1 tablet by mouth daily.  Yes [provider]  simvastatin (ZOCOR) 20 MG tablet Take 20 mg by mouth at bedtime.   Yes [provider]  tamsulosin (FLOMAX) 0.4 MG CAPS capsule TAKE 1 CAPSULE BY MOUTH  ONCE DAILY 30 MINUTES AFTER SAME MEAL EACH DAY. 07/17/18  Yes [provider]  sertraline (ZOLOFT) 25 MG tablet Take 25 mg by mouth daily. Patient not taking: No sig reported 07/22/20   [provider]      VITAL  SIGNS:  Blood pressure (!) 110/55, pulse 88, temperature 100 F (37.8 C), temperature source Oral, resp. rate 18, height 5\' 10"  (1.778 m), weight 106.6 kg, SpO2 95 %.  PHYSICAL EXAMINATION:  Physical Exam  GENERAL:  70 y.o.-year-old Caucasian male patient lying in the bed with no acute distress.  EYES: Pupils equal, round, reactive to light and accommodation. No scleral icterus. Extraocular muscles intact.  HEENT: Head atraumatic, normocephalic. Oropharynx and nasopharynx clear.  NECK:  Supple, no jugular venous distention. No thyroid enlargement, no tenderness.  LUNGS: Normal breath sounds bilaterally, no wheezing, rales,rhonchi or crepitation. No use of accessory muscles of respiration.  CARDIOVASCULAR: Regular rate and rhythm, S1, S2 normal. No murmurs, rubs, or gallops.  ABDOMEN: Soft, nondistended, nontender. Bowel sounds present. No organomegaly or mass.  EXTREMITIES: No pedal edema, cyanosis, or clubbing.  NEUROLOGIC: Cranial nerves II through XII are intact. Muscle strength 5/5 in all extremities. Sensation intact. Gait not checked.  PSYCHIATRIC: The patient is alert and oriented x 3.  Normal affect and good eye contact. SKIN: No obvious rash, lesion, or ulcer.   LABORATORY PANEL:   CBC Recent Labs  Lab 08/22/20 2142  WBC 11.5*  HGB 13.8  HCT 39.1  PLT 205   ------------------------------------------------------------------------------------------------------------------  Chemistries  Recent Labs  Lab 08/22/20 2142 08/22/20 2350  NA 130*  --   K 3.7  --   CL 94*  --   CO2 24  --   GLUCOSE 119*  --   BUN 12  --   CREATININE 0.81  --   CALCIUM 8.7*  --   MG  --  1.7  AST 33  --   ALT 29  --   ALKPHOS 53  --   BILITOT 1.1  --    ------------------------------------------------------------------------------------------------------------------  Cardiac Enzymes No results for input(s): TROPONINI in the last 168  hours. ------------------------------------------------------------------------------------------------------------------  RADIOLOGY:  DG Chest Port 1 View  Result Date: 08/22/2020 CLINICAL DATA:  Fever.  Chills. EXAM: PORTABLE CHEST 1 VIEW COMPARISON:  None. FINDINGS: Mild cardiomegaly. Normal mediastinal contours. No acute airspace disease. No pulmonary edema, pleural effusion, or pneumothorax. No acute osseous abnormalities are seen. IMPRESSION: Mild cardiomegaly without acute pulmonary process. Electronically Signed   By: 08/24/2020 M.D.   On: 08/22/2020 23:38      IMPRESSION AND PLAN:  Active Problems:   Sepsis due to gram-negative UTI (HCC)  1.  Sepsis secondary to UTI.  He meets severe sepsis criteria due to elevated lactic acid. - The patient will be admitted to a medical bed. - We will continue antibiotic therapy with IV Rocephin. - We will follow urine and blood cultures. -We will follow elevated lactic acid.  2.  Essential hypertension. - We will continue Cozaar and HCTZ.  3.  Dyslipidemia. - We will continue statin therapy.  4.  BPH.   - We will continue Flomax.  5.  Depression. - We will continue Zoloft.  DVT prophylaxis: Lovenox. Code Status: full code. Family Communication:  The plan of care  was discussed in details with the patient (and family). I answered all questions. The patient agreed to proceed with the above mentioned plan. Further management will depend upon hospital course. Disposition Plan: Back to previous home environment Consults called: none. All the records are reviewed and case discussed with ED provider.  Status is: Inpatient  Remains inpatient appropriate because:Ongoing diagnostic testing needed not appropriate for outpatient work up, Unsafe d/c plan, IV treatments appropriate due to intensity of illness or inability to take PO and Inpatient level of care appropriate due to severity of illness   Dispo: The patient is from: Home               Anticipated d/c is to: Home              Patient currently is not medically stable to d/c.   Difficult to place patient No      TOTAL TIME TAKING CARE OF THIS PATIENT: 55 minutes.    Hannah Beat M.D on 08/23/2020 at 12:48 AM  [facility]   From 7 PM-7 AM, contact night-coverage [email or web address]  CC: Primary care physician; Marisue Ivan, MD

## 2020-08-23 NOTE — Plan of Care (Signed)
  Problem: Health Behavior/Discharge Planning: Goal: Ability to manage health-related needs will improve Outcome: Progressing   Problem: Clinical Measurements: Goal: Ability to maintain clinical measurements within normal limits will improve Outcome: Progressing Goal: Will remain free from infection Outcome: Progressing   Problem: Activity: Goal: Risk for activity intolerance will decrease Outcome: Progressing   Problem: Nutrition: Goal: Adequate nutrition will be maintained Outcome: Progressing   Problem: Coping: Goal: Level of anxiety will decrease Outcome: Progressing   Problem: Elimination: Goal: Will not experience complications related to bowel motility Outcome: Progressing Goal: Will not experience complications related to urinary retention Outcome: Progressing   Problem: Safety: Goal: Ability to remain free from injury will improve Outcome: Progressing

## 2020-08-23 NOTE — Progress Notes (Signed)
Notified bedside nurse of need to draw repeat lactic acid. 

## 2020-08-23 NOTE — Progress Notes (Signed)
   08/23/20 1120  Assess: MEWS Score  Temp (!) 102.2 F (39 C) (RN Kenyetta Wimbish notified)  BP (!) 131/52  Pulse Rate 74  Resp 18  SpO2 96 %  O2 Device Room Air  Assess: MEWS Score  MEWS Temp 2  MEWS Systolic 0  MEWS Pulse 0  MEWS RR 0  MEWS LOC 0  MEWS Score 2  MEWS Score Color Yellow  Assess: if the MEWS score is Yellow or Red  Were vital signs taken at a resting state? Yes  Focused Assessment Change from prior assessment (see assessment flowsheet)  Early Detection of Sepsis Score *See Row Information* High  MEWS guidelines implemented *See Row Information* No, other (Comment) (Patient given tylenol, will continue on IVF and will start antibiotic therapy.)  Treat  Pain Scale 0-10  Pain Score 3  Pain Intervention(s) Medication (See eMAR) (Tylenol per orders for fever and Headache)  Take Vital Signs  Increase Vital Sign Frequency  Yellow: Q 2hr X 2 then Q 4hr X 2, if remains yellow, continue Q 4hrs  Notify: Charge Nurse/RN  Name of Charge Nurse/RN Notified Misty RN  Date Charge Nurse/RN Notified 08/23/20  Time Charge Nurse/RN Notified 1126  Notify: Provider  Provider Name/Title Dr. Mayford Knife  Date Provider Notified 08/23/20  Time Provider Notified 1126  Notification Type Page  Notification Reason Change in status (Fever)  Provider response No new orders  Date of Provider Response 08/23/20  Time of Provider Response 1130

## 2020-08-23 NOTE — Progress Notes (Signed)
   08/23/20 1120  Assess: MEWS Score  Temp (!) 102.2 F (39 C) (RN Clemmie Buelna notified)  BP (!) 131/52  Pulse Rate 74  Resp 18  SpO2 96 %  O2 Device Room Air  Assess: MEWS Score  MEWS Temp 2  MEWS Systolic 0  MEWS Pulse 0  MEWS RR 0  MEWS LOC 0  MEWS Score 2  MEWS Score Color Yellow  Treat  Pain Scale 0-10  Pain Score 3  Pain Intervention(s) Medication (See eMAR) (Tylenol per orders for fever and Headache)  Take Vital Signs  Increase Vital Sign Frequency  Yellow: Q 2hr X 2 then Q 4hr X 2, if remains yellow, continue Q 4hrs  Notify: Charge Nurse/RN  Name of Charge Nurse/RN Notified Misty RN  Date Charge Nurse/RN Notified 08/23/20  Time Charge Nurse/RN Notified 1126  Notify: Provider  Provider Name/Title Dr. Mayford Knife  Date Provider Notified 08/23/20  Time Provider Notified 1126  Notification Type Page  Notification Reason Change in status (Fever)

## 2020-08-24 DIAGNOSIS — D696 Thrombocytopenia, unspecified: Secondary | ICD-10-CM | POA: Diagnosis not present

## 2020-08-24 DIAGNOSIS — E872 Acidosis, unspecified: Secondary | ICD-10-CM

## 2020-08-24 DIAGNOSIS — E66811 Obesity, class 1: Secondary | ICD-10-CM

## 2020-08-24 DIAGNOSIS — N401 Enlarged prostate with lower urinary tract symptoms: Secondary | ICD-10-CM

## 2020-08-24 DIAGNOSIS — E871 Hypo-osmolality and hyponatremia: Secondary | ICD-10-CM

## 2020-08-24 DIAGNOSIS — A4159 Other Gram-negative sepsis: Principal | ICD-10-CM

## 2020-08-24 DIAGNOSIS — E669 Obesity, unspecified: Secondary | ICD-10-CM

## 2020-08-24 DIAGNOSIS — F32A Depression, unspecified: Secondary | ICD-10-CM

## 2020-08-24 DIAGNOSIS — R350 Frequency of micturition: Secondary | ICD-10-CM

## 2020-08-24 LAB — COMPREHENSIVE METABOLIC PANEL
ALT: 21 U/L (ref 0–44)
AST: 25 U/L (ref 15–41)
Albumin: 3.4 g/dL — ABNORMAL LOW (ref 3.5–5.0)
Alkaline Phosphatase: 45 U/L (ref 38–126)
Anion gap: 10 (ref 5–15)
BUN: 13 mg/dL (ref 8–23)
CO2: 22 mmol/L (ref 22–32)
Calcium: 8.2 mg/dL — ABNORMAL LOW (ref 8.9–10.3)
Chloride: 98 mmol/L (ref 98–111)
Creatinine, Ser: 0.86 mg/dL (ref 0.61–1.24)
GFR, Estimated: >60 mL/min (ref >60)
Glucose, Bld: 129 mg/dL — ABNORMAL HIGH (ref 70–99)
Potassium: 3.7 mmol/L (ref 3.5–5.1)
Sodium: 130 mmol/L — ABNORMAL LOW (ref 135–145)
Total Bilirubin: 1.1 mg/dL (ref 0.3–1.2)
Total Protein: 6 g/dL — ABNORMAL LOW (ref 6.5–8.1)

## 2020-08-24 LAB — CBC
HCT: 35.3 % — ABNORMAL LOW (ref 39.0–52.0)
Hemoglobin: 12.9 g/dL — ABNORMAL LOW (ref 13.0–17.0)
MCH: 33.5 pg (ref 26.0–34.0)
MCHC: 36.5 g/dL — ABNORMAL HIGH (ref 30.0–36.0)
MCV: 91.7 fL (ref 80.0–100.0)
Platelets: 147 10*3/uL — ABNORMAL LOW (ref 150–400)
RBC: 3.85 MIL/uL — ABNORMAL LOW (ref 4.22–5.81)
RDW: 12.9 % (ref 11.5–15.5)
WBC: 18.2 10*3/uL — ABNORMAL HIGH (ref 4.0–10.5)
nRBC: 0 % (ref 0.0–0.2)

## 2020-08-24 LAB — MAGNESIUM: Magnesium: 2 mg/dL (ref 1.7–2.4)

## 2020-08-24 MED ORDER — GLUCOSAMINE-CHONDROITIN 750-600 MG PO TABS
2.0000 | ORAL_TABLET | Freq: Every day | ORAL | Status: DC
  Administered 2020-08-25 – 2020-08-27 (×3): 2 via ORAL
  Filled 2020-08-24: qty 2

## 2020-08-24 MED ORDER — SERTRALINE HCL 50 MG PO TABS
25.0000 mg | ORAL_TABLET | Freq: Every day | ORAL | Status: DC
  Administered 2020-08-25 – 2020-08-27 (×3): 25 mg via ORAL
  Filled 2020-08-24 (×3): qty 1

## 2020-08-24 MED ORDER — FINASTERIDE 5 MG PO TABS
5.0000 mg | ORAL_TABLET | Freq: Every day | ORAL | Status: DC
  Administered 2020-08-24 – 2020-08-27 (×4): 5 mg via ORAL
  Filled 2020-08-24 (×5): qty 1

## 2020-08-24 NOTE — Progress Notes (Signed)
Patient ID: Nathan Curtis, male   DOB: 08-Jun-1950, 70 y.o.   MRN: [id number] [facility] PROGRESS NOTE  Jamario Colina MGQ:[id number] DOB: 11-23-1950 DOA: 08/22/2020 PCP: Marisue Ivan, MD  HPI/Subjective: Patient still not feeling that good.  Admitted with severe sepsis and pyelonephritis.  Still feels weak.  No problems walking around.  Objective: Vitals:   08/24/20 1000 08/24/20 1110  BP:  (!) 123/54  Pulse:  69  Resp:  20  Temp:  98.8 F (37.1 C)  SpO2: 90% 93%    Intake/Output Summary (Last 24 hours) at 08/24/2020 1449 Last data filed at 08/24/2020 1411 Gross per 24 hour  Intake 600 ml  Output 675 ml  Net -75 ml   Filed Weights   08/22/20 2136  Weight: 106.6 kg    ROS: Review of Systems  Constitutional: Positive for malaise/fatigue.  Respiratory: Negative for shortness of breath.   Cardiovascular: Negative for chest pain.  Gastrointestinal: Negative for abdominal pain, nausea and vomiting.  Musculoskeletal: Negative for joint pain.   Exam: Physical Exam HENT:     Head: Normocephalic.     Mouth/Throat:     Pharynx: No oropharyngeal exudate.  Eyes:     General: Lids are normal.     Conjunctiva/sclera: Conjunctivae normal.     Pupils: Pupils are equal, round, and reactive to light.  Cardiovascular:     Rate and Rhythm: Normal rate and regular rhythm.     Heart sounds: Normal heart sounds, S1 normal and S2 normal.  Pulmonary:     Breath sounds: Normal breath sounds. No decreased breath sounds, wheezing, rhonchi or rales.  Abdominal:     Palpations: Abdomen is soft.     Tenderness: There is no abdominal tenderness.  Genitourinary:    Comments: Prostate enlarged but no tenderness. Musculoskeletal:     Right ankle: No swelling.     Left ankle: No swelling.  Skin:    General: Skin is warm.     Findings: No rash.  Neurological:     Mental Status: He is alert and oriented to person, place, and time.       Data Reviewed: Basic Metabolic  Panel: Recent Labs  Lab 08/22/20 2142 08/22/20 2350 08/23/20 0303 08/24/20 0549  NA 130*  --  131* 130*  K 3.7  --  3.5 3.7  CL 94*  --  98 98  CO2 24  --  24 22  GLUCOSE 119*  --  122* 129*  BUN 12  --  14 13  CREATININE 0.81  --  0.96 0.86  CALCIUM 8.7*  --  8.3* 8.2*  MG  --  1.7  --  2.0   Liver Function Tests: Recent Labs  Lab 08/22/20 2142 08/24/20 0549  AST 33 25  ALT 29 21  ALKPHOS 53 45  BILITOT 1.1 1.1  PROT 6.9 6.0*  ALBUMIN 4.6 3.4*   No results for input(s): LIPASE, AMYLASE in the last 168 hours. No results for input(s): AMMONIA in the last 168 hours. CBC: Recent Labs  Lab 08/22/20 2142 08/23/20 0303 08/24/20 0549  WBC 11.5* 15.9* 18.2*  NEUTROABS 10.6*  --   --   HGB 13.8 12.2* 12.9*  HCT 39.1 33.7* 35.3*  MCV 93.8 93.6 91.7  PLT 205 174 147*     Recent Results (from the past 240 hour(s))  Urine Culture     Status: Abnormal (Preliminary result)   Collection Time: 08/22/20 11:29 PM   Specimen: Urine, Random  Result Value Ref Range  Status   Specimen Description   Final    URINE, RANDOM Performed at [facility], [address] [place], [place] [address]    Special Requests   Final    NONE Performed at [facility], [address] [place], [place] [address]    Culture >=100,000 COLONIES/mL ENTEROBACTER AEROGENES (A)  Final   Report Status PENDING  Incomplete  Culture, blood (Routine X 2) w Reflex to ID Panel     Status: None (Preliminary result)   Collection Time: 08/22/20 11:29 PM   Specimen: BLOOD  Result Value Ref Range Status   Specimen Description BLOOD BLOOD RIGHT FOREARM  Final   Special Requests   Final    BOTTLES DRAWN AEROBIC AND ANAEROBIC Blood Culture results may not be optimal due to an excessive volume of blood received in culture bottles   Culture   Final    NO GROWTH 2 DAYS Performed at [facility], [address] [place], [place] [address]    Report Status PENDING  Incomplete  Resp  Panel by RT-PCR (Flu A&B, Covid)     Status: None   Collection Time: 08/22/20 11:29 PM   Specimen: Nasopharyngeal(NP) swabs in vial transport medium  Result Value Ref Range Status   SARS Coronavirus 2 by RT PCR NEGATIVE NEGATIVE Final    Comment: (NOTE) SARS-CoV-2 target nucleic acids are NOT DETECTED.  The SARS-CoV-2 RNA is generally detectable in upper respiratory specimens during the acute phase of infection. The lowest concentration of SARS-CoV-2 viral copies this assay can detect is 138 copies/mL. A negative result does not preclude SARS-Cov-2 infection and should not be used as the sole basis for treatment or other patient management decisions. A negative result may occur with  improper specimen collection/handling, submission of specimen other than nasopharyngeal swab, presence of viral mutation(s) within the areas targeted by this assay, and inadequate number of viral copies(<138 copies/mL). A negative result must be combined with clinical observations, patient history, and epidemiological information. The expected result is Negative.  Fact Sheet for Patients:  BloggerCourse.com  Fact Sheet for Healthcare Providers:  SeriousBroker.it  This test is no t yet approved or cleared by the [place] FDA and  has been authorized for detection and/or diagnosis of SARS-CoV-2 by FDA under an Emergency Use Authorization (EUA). This EUA will remain  in effect (meaning this test can be used) for the duration of the COVID-19 declaration under Section 564(b)(1) of the Act, 21 U.S.C.section 360bbb-3(b)(1), unless the authorization is terminated  or revoked sooner.       Influenza A by PCR NEGATIVE NEGATIVE Final   Influenza B by PCR NEGATIVE NEGATIVE Final    Comment: (NOTE) The Xpert Xpress SARS-CoV-2/FLU/RSV plus assay is intended as an aid in the diagnosis of influenza from Nasopharyngeal swab specimens and should not be used as  a sole basis for treatment. Nasal washings and aspirates are unacceptable for Xpert Xpress SARS-CoV-2/FLU/RSV testing.  Fact Sheet for Patients: BloggerCourse.com  Fact Sheet for Healthcare Providers: SeriousBroker.it  This test is not yet approved or cleared by the [place] FDA and has been authorized for detection and/or diagnosis of SARS-CoV-2 by FDA under an Emergency Use Authorization (EUA). This EUA will remain in effect (meaning this test can be used) for the duration of the COVID-19 declaration under Section 564(b)(1) of the Act, 21 U.S.C. section 360bbb-3(b)(1), unless the authorization is terminated or revoked.  Performed at [facility], [address] [place], [place] [address]  Blood Culture (routine x 2)     Status: None (Preliminary result)   Collection Time: 08/22/20 11:29 PM   Specimen: BLOOD  Result Value Ref Range Status   Specimen Description BLOOD BLOOD RIGHT FOREARM  Final   Special Requests   Final    BOTTLES DRAWN AEROBIC AND ANAEROBIC Blood Culture adequate volume   Culture   Final    NO GROWTH 1 DAY Performed at [facility], [address] [place], [place] [address]    Report Status PENDING  Incomplete     Studies: DG Chest Port 1 View  Result Date: 08/22/2020 CLINICAL DATA:  Fever.  Chills. EXAM: PORTABLE CHEST 1 VIEW COMPARISON:  None. FINDINGS: Mild cardiomegaly. Normal mediastinal contours. No acute airspace disease. No pulmonary edema, pleural effusion, or pneumothorax. No acute osseous abnormalities are seen. IMPRESSION: Mild cardiomegaly without acute pulmonary process. Electronically Signed   By: Narda RutherfordMelanie  Sanford M.D.   On: 08/22/2020 23:38    Scheduled Meds: . cholecalciferol  1,000 Units Oral Daily  . enoxaparin (LOVENOX) injection  0.5 mg/kg Subcutaneous Q24H  . finasteride  5 mg Oral Daily  . gabapentin  300 mg Oral BID  . Glucosamine-Chondroitin  2 tablet  Oral Daily  . multivitamin with minerals  1 tablet Oral Daily  . [START ON 08/25/2020] sertraline  25 mg Oral Daily  . simvastatin  20 mg Oral QHS  . tamsulosin  0.4 mg Oral Daily  . zinc sulfate  220 mg Oral Daily   Continuous Infusions: . sodium chloride 40 mL/hr at 08/24/20 1233  . cefTRIAXone (ROCEPHIN)  IV 2 g (08/24/20 1237)    Assessment/Plan:  1.  Severe sepsis, present on admission, fever, leukocytosis and lactic acidosis.  Patient has Enterobacter growing out of the urine culture.  On empiric Rocephin.  Patient had a fever of 102 on 08/23/2020.  White blood cell count still elevated at 18.2 today.  Platelet count dropped from 174 down to 147. 2.  BPH.  On Flomax.  We will add Proscar.  No signs of prostatitis.  Check postvoid residual volume 3.  Thrombocytopenia likely secondary to sepsis. 4.  Hyponatremia.  Sodium 130.  Continue to monitor 5.  Lactic acidosis this is improved with IV fluids 6.  Depression continue Cymbalta 7.  Hyperlipidemia on simvastatin 8.  Obesity with a BMI of 33.72      Code Status:     Code Status Orders  (From admission, onward)         Start     Ordered   08/23/20 0039  Full code  Continuous        08/23/20 0043        Code Status History    Date Active Date Inactive Code Status Order ID Comments User Context   11/25/2018 1123 11/26/2018 0318 Full Code [id number]  Sebastian AcheGrady, [place], MD [facility]   02/08/2016 1630 02/10/2016 2228 Full Code [id number]  Tressie StalkerJenkins, Jeffrey, MD Inpatient   Advance Care Planning Activity     Family Communication: Tried to reach the patient's wife on the phone. Disposition Plan: Status is: Inpatient  Dispo: The patient is from: Home              Anticipated d/c is to: Home in the next day or so depending on clinical progress              Patient currently being treated for severe sepsis.  Awaiting urine culture sensitivities.   Difficult to place patient.  No.  Antibiotics:  Rocephin  Time spent: 28  minutes  [employer]

## 2020-08-24 NOTE — Progress Notes (Signed)
   08/24/20 1555  Assess: MEWS Score  Temp (!) 102.4 F (39.1 C)  BP 131/64  Pulse Rate 78  Resp 18  Level of Consciousness Alert  SpO2 91 %  O2 Device Room Air  Assess: MEWS Score  MEWS Temp 2  MEWS Systolic 0  MEWS Pulse 0  MEWS RR 0  MEWS LOC 0  MEWS Score 2  MEWS Score Color Yellow  Assess: if the MEWS score is Yellow or Red  Were vital signs taken at a resting state? Yes  Focused Assessment No change from prior assessment  Early Detection of Sepsis Score *See Row Information* Low  MEWS guidelines implemented *See Row Information* Yes (Patient given tylenol for fever)  Treat  MEWS Interventions Administered prn meds/treatments  Take Vital Signs  Increase Vital Sign Frequency  Yellow: Q 2hr X 2 then Q 4hr X 2, if remains yellow, continue Q 4hrs  Notify: Charge Nurse/RN  Name of Charge Nurse/RN Notified Lupita Leash, RN  Date Charge Nurse/RN Notified 08/24/20  Time Charge Nurse/RN Notified 1555  Notify: Provider  Provider Name/Title Dr. Renae Gloss  Date Provider Notified 08/24/20  Time Provider Notified 1558  Notification Type Page  Notification Reason Change in status (Fever)  Provider response See new orders  Date of Provider Response 08/23/20  Time of Provider Response 1600

## 2020-08-24 NOTE — Progress Notes (Signed)
Informed by NA that patients sat was 85% on room air. O2 applied at 2L/M via [place] with sat up to 94%.

## 2020-08-25 ENCOUNTER — Inpatient Hospital Stay: Payer: Medicare Other

## 2020-08-25 DIAGNOSIS — J9601 Acute respiratory failure with hypoxia: Secondary | ICD-10-CM

## 2020-08-25 DIAGNOSIS — A4159 Other Gram-negative sepsis: Secondary | ICD-10-CM | POA: Diagnosis not present

## 2020-08-25 DIAGNOSIS — D696 Thrombocytopenia, unspecified: Secondary | ICD-10-CM | POA: Diagnosis not present

## 2020-08-25 DIAGNOSIS — N401 Enlarged prostate with lower urinary tract symptoms: Secondary | ICD-10-CM | POA: Diagnosis not present

## 2020-08-25 DIAGNOSIS — G4733 Obstructive sleep apnea (adult) (pediatric): Secondary | ICD-10-CM

## 2020-08-25 LAB — PROCALCITONIN: Procalcitonin: 0.83 ng/mL

## 2020-08-25 LAB — CBC
HCT: 34 % — ABNORMAL LOW (ref 39.0–52.0)
Hemoglobin: 12 g/dL — ABNORMAL LOW (ref 13.0–17.0)
MCH: 33.2 pg (ref 26.0–34.0)
MCHC: 35.3 g/dL (ref 30.0–36.0)
MCV: 94.2 fL (ref 80.0–100.0)
Platelets: 142 10*3/uL — ABNORMAL LOW (ref 150–400)
RBC: 3.61 MIL/uL — ABNORMAL LOW (ref 4.22–5.81)
RDW: 12.9 % (ref 11.5–15.5)
WBC: 14.9 10*3/uL — ABNORMAL HIGH (ref 4.0–10.5)
nRBC: 0 % (ref 0.0–0.2)

## 2020-08-25 LAB — BASIC METABOLIC PANEL
Anion gap: 8 (ref 5–15)
BUN: 14 mg/dL (ref 8–23)
CO2: 23 mmol/L (ref 22–32)
Calcium: 8.4 mg/dL — ABNORMAL LOW (ref 8.9–10.3)
Chloride: 101 mmol/L (ref 98–111)
Creatinine, Ser: 0.77 mg/dL (ref 0.61–1.24)
GFR, Estimated: >60 mL/min (ref >60)
Glucose, Bld: 121 mg/dL — ABNORMAL HIGH (ref 70–99)
Potassium: 4 mmol/L (ref 3.5–5.1)
Sodium: 132 mmol/L — ABNORMAL LOW (ref 135–145)

## 2020-08-25 LAB — URINE CULTURE: Culture: >=100000 — AB

## 2020-08-25 MED ORDER — SODIUM CHLORIDE 0.9 % IV SOLN
2.0000 g | Freq: Three times a day (TID) | INTRAVENOUS | Status: DC
  Administered 2020-08-25 – 2020-08-27 (×6): 2 g via INTRAVENOUS
  Filled 2020-08-25 (×8): qty 2

## 2020-08-25 MED ORDER — FUROSEMIDE 10 MG/ML IJ SOLN
40.0000 mg | Freq: Once | INTRAMUSCULAR | Status: AC
  Administered 2020-08-25: 40 mg via INTRAVENOUS
  Filled 2020-08-25: qty 4

## 2020-08-25 MED ORDER — AZITHROMYCIN 250 MG PO TABS
250.0000 mg | ORAL_TABLET | Freq: Every day | ORAL | Status: DC
  Administered 2020-08-26: 250 mg via ORAL
  Filled 2020-08-25: qty 1

## 2020-08-25 MED ORDER — TAMSULOSIN HCL 0.4 MG PO CAPS
0.4000 mg | ORAL_CAPSULE | Freq: Two times a day (BID) | ORAL | Status: DC
  Administered 2020-08-25 – 2020-08-27 (×5): 0.4 mg via ORAL
  Filled 2020-08-25 (×5): qty 1

## 2020-08-25 MED ORDER — AZITHROMYCIN 250 MG PO TABS
500.0000 mg | ORAL_TABLET | Freq: Every day | ORAL | Status: AC
  Administered 2020-08-25: 500 mg via ORAL
  Filled 2020-08-25: qty 2

## 2020-08-25 NOTE — Progress Notes (Signed)
Patient ID: Nathan Curtis, male   DOB: 1950/05/25, 70 y.o.   MRN: [id number] [facility] PROGRESS NOTE  Dailon Sheeran ZTI:[id number] DOB: [birth year] DOA: 08/22/2020 PCP: Marisue Ivan, MD  HPI/Subjective: Patient seen this morning.  Still not feeling that good.  Unable to come off the oxygen yesterday.  Some chronic back pain but not out of the usual.  Still feels weak.  Still having trouble with urination.  Spiked a fever of 102 yesterday.  Poor appetite.  Admitted with sepsis.  Objective: Vitals:   08/25/20 0945 08/25/20 1113  BP: (!) 165/84 (!) 163/76  Pulse: 80 78  Resp:  16  Temp: 99.5 F (37.5 C) 99.9 F (37.7 C)  SpO2: 92% 90%    Intake/Output Summary (Last 24 hours) at 08/25/2020 1327 Last data filed at 08/25/2020 1122 Gross per 24 hour  Intake 600 ml  Output 1689 ml  Net -1089 ml   Filed Weights   08/22/20 2136  Weight: 106.6 kg    ROS: Review of Systems  Constitutional: Positive for malaise/fatigue.  Respiratory: Positive for cough and shortness of breath.   Cardiovascular: Negative for chest pain.  Gastrointestinal: Negative for abdominal pain, nausea and vomiting.  Genitourinary: Positive for dysuria.   Exam: Physical Exam HENT:     Head: Normocephalic.     Mouth/Throat:     Pharynx: No oropharyngeal exudate.  Eyes:     General: Lids are normal.     Conjunctiva/sclera: Conjunctivae normal.     Pupils: Pupils are equal, round, and reactive to light.  Cardiovascular:     Rate and Rhythm: Normal rate and regular rhythm.     Heart sounds: Normal heart sounds, S1 normal and S2 normal.  Pulmonary:     Breath sounds: Examination of the right-lower field reveals decreased breath sounds. Examination of the left-lower field reveals decreased breath sounds. Decreased breath sounds present. No wheezing, rhonchi or rales.  Abdominal:     Palpations: Abdomen is soft.     Tenderness: There is no abdominal tenderness.  Musculoskeletal:     Right lower  leg: No swelling.     Left lower leg: No swelling.  Skin:    General: Skin is warm.     Findings: No rash.  Neurological:     Mental Status: He is alert and oriented to person, place, and time.       Data Reviewed: Basic Metabolic Panel: Recent Labs  Lab 08/22/20 2142 08/22/20 2350 08/23/20 0303 08/24/20 0549 08/25/20 0500  NA 130*  --  131* 130* 132*  K 3.7  --  3.5 3.7 4.0  CL 94*  --  98 98 101  CO2 24  --  24 22 23   GLUCOSE 119*  --  122* 129* 121*  BUN 12  --  14 13 14   CREATININE 0.81  --  0.96 0.86 0.77  CALCIUM 8.7*  --  8.3* 8.2* 8.4*  MG  --  1.7  --  2.0  --    Liver Function Tests: Recent Labs  Lab 08/22/20 2142 08/24/20 0549  AST 33 25  ALT 29 21  ALKPHOS 53 45  BILITOT 1.1 1.1  PROT 6.9 6.0*  ALBUMIN 4.6 3.4*   CBC: Recent Labs  Lab 08/22/20 2142 08/23/20 0303 08/24/20 0549 08/25/20 0500  WBC 11.5* 15.9* 18.2* 14.9*  NEUTROABS 10.6*  --   --   --   HGB 13.8 12.2* 12.9* 12.0*  HCT 39.1 33.7* 35.3* 34.0*  MCV 93.8 93.6 91.7  94.2  PLT 205 174 147* 142*     Recent Results (from the past 240 hour(s))  Urine Culture     Status: Abnormal   Collection Time: 08/22/20 11:29 PM   Specimen: Urine, Random  Result Value Ref Range Status   Specimen Description   Final    URINE, RANDOM Performed at [facility], [address], [place], [place] [address]    Special Requests   Final    NONE Performed at [facility], [address] [place], [place] [address]    Culture >=100,000 COLONIES/mL ENTEROBACTER AEROGENES (A)  Final   Report Status 08/25/2020 FINAL  Final   Organism ID, Bacteria ENTEROBACTER AEROGENES (A)  Final      Susceptibility   Enterobacter aerogenes - MIC*    CEFAZOLIN >=64 RESISTANT Resistant     CEFEPIME <=0.12 SENSITIVE Sensitive     CEFTRIAXONE <=0.25 SENSITIVE Sensitive     CIPROFLOXACIN <=0.25 SENSITIVE Sensitive     GENTAMICIN <=1 SENSITIVE Sensitive     IMIPENEM 2 SENSITIVE Sensitive      NITROFURANTOIN 64 INTERMEDIATE Intermediate     TRIMETH/SULFA <=20 SENSITIVE Sensitive     PIP/TAZO <=4 SENSITIVE Sensitive     * >=100,000 COLONIES/mL ENTEROBACTER AEROGENES  Culture, blood (Routine X 2) w Reflex to ID Panel     Status: None (Preliminary result)   Collection Time: 08/22/20 11:29 PM   Specimen: BLOOD  Result Value Ref Range Status   Specimen Description BLOOD BLOOD RIGHT FOREARM  Final   Special Requests   Final    BOTTLES DRAWN AEROBIC AND ANAEROBIC Blood Culture results may not be optimal due to an excessive volume of blood received in culture bottles   Culture   Final    NO GROWTH 2 DAYS Performed at [facility], [address] [place], [place] [address]    Report Status PENDING  Incomplete  Resp Panel by RT-PCR (Flu A&B, Covid)     Status: None   Collection Time: 08/22/20 11:29 PM   Specimen: Nasopharyngeal(NP) swabs in vial transport medium  Result Value Ref Range Status   SARS Coronavirus 2 by RT PCR NEGATIVE NEGATIVE Final    Comment: (NOTE) SARS-CoV-2 target nucleic acids are NOT DETECTED.  The SARS-CoV-2 RNA is generally detectable in upper respiratory specimens during the acute phase of infection. The lowest concentration of SARS-CoV-2 viral copies this assay can detect is 138 copies/mL. A negative result does not preclude SARS-Cov-2 infection and should not be used as the sole basis for treatment or other patient management decisions. A negative result may occur with  improper specimen collection/handling, submission of specimen other than nasopharyngeal swab, presence of viral mutation(s) within the areas targeted by this assay, and inadequate number of viral copies(<138 copies/mL). A negative result must be combined with clinical observations, patient history, and epidemiological information. The expected result is Negative.  Fact Sheet for Patients:  BloggerCourse.com  Fact Sheet for Healthcare Providers:   SeriousBroker.it  This test is no t yet approved or cleared by the [place] FDA and  has been authorized for detection and/or diagnosis of SARS-CoV-2 by FDA under an Emergency Use Authorization (EUA). This EUA will remain  in effect (meaning this test can be used) for the duration of the COVID-19 declaration under Section 564(b)(1) of the Act, 21 U.S.C.section 360bbb-3(b)(1), unless the authorization is terminated  or revoked sooner.       Influenza A by PCR NEGATIVE NEGATIVE Final   Influenza B  by PCR NEGATIVE NEGATIVE Final    Comment: (NOTE) The Xpert Xpress SARS-CoV-2/FLU/RSV plus assay is intended as an aid in the diagnosis of influenza from Nasopharyngeal swab specimens and should not be used as a sole basis for treatment. Nasal washings and aspirates are unacceptable for Xpert Xpress SARS-CoV-2/FLU/RSV testing.  Fact Sheet for Patients: BloggerCourse.com  Fact Sheet for Healthcare Providers: SeriousBroker.it  This test is not yet approved or cleared by the [place] FDA and has been authorized for detection and/or diagnosis of SARS-CoV-2 by FDA under an Emergency Use Authorization (EUA). This EUA will remain in effect (meaning this test can be used) for the duration of the COVID-19 declaration under Section 564(b)(1) of the Act, 21 U.S.C. section 360bbb-3(b)(1), unless the authorization is terminated or revoked.  Performed at [facility], [address] [place], [place] [address]   Blood Culture (routine x 2)     Status: None (Preliminary result)   Collection Time: 08/22/20 11:29 PM   Specimen: BLOOD  Result Value Ref Range Status   Specimen Description BLOOD BLOOD RIGHT FOREARM  Final   Special Requests   Final    BOTTLES DRAWN AEROBIC AND ANAEROBIC Blood Culture adequate volume   Culture   Final    NO GROWTH 1 DAY Performed at [facility], [address], [place], [place] [address]    Report Status PENDING  Incomplete     Studies: DG Chest Port 1 View  Result Date: 08/25/2020 CLINICAL DATA:  Fever. EXAM: PORTABLE CHEST 1 VIEW COMPARISON:  08/22/2020 FINDINGS: New interstitial densities in the central aspect of both lungs, right side greater than left. Heart size is within normal limits and stable. The trachea is midline. Bone structures are unremarkable. IMPRESSION: New enlarged interstitial lung densities bilaterally, right side greater than left. Differential diagnosis includes atypical infection versus asymmetric pulmonary edema. Electronically Signed   By: Richarda Overlie M.D.   On: 08/25/2020 08:17    Scheduled Meds: . [START ON 08/26/2020] azithromycin  250 mg Oral Daily  . cholecalciferol  1,000 Units Oral Daily  . enoxaparin (LOVENOX) injection  0.5 mg/kg Subcutaneous Q24H  . finasteride  5 mg Oral Daily  . gabapentin  300 mg Oral BID  . Glucosamine-Chondroitin  2 tablet Oral Daily  . multivitamin with minerals  1 tablet Oral Daily  . sertraline  25 mg Oral Daily  . simvastatin  20 mg Oral QHS  . tamsulosin  0.4 mg Oral BID  . zinc sulfate  220 mg Oral Daily   Continuous Infusions: . ceFEPime (MAXIPIME) IV      Assessment/Plan:  1. Severe sepsis, present on admission with fever, leukocytosis and lactic acidosis.  Patient also has acute hypoxic respiratory failure.  Enterobacter growing out of the urine culture.  Patient had a fever of 102 on 08/25/2018 2 in the afternoon.  Changed antibiotics from Rocephin to cefepime.  Added Zithromax.  White blood cell count trending a little bit better down to 14.9.  Procalcitonin trending better from 2.97 down to 0.83.  Lactic acid normalized on 08/23/2020. 2. Acute hypoxic respiratory failure possibly secondary to atypical pneumonia versus fluid overload.  1 dose of Lasix today.  Added Zithromax.  Try to taper off oxygen.  Incentive spirometer. 3. BPH on Flomax increased to twice daily dosing.   Continue Proscar which was added yesterday.  No signs of prostatitis. 4. Thrombocytopenia likely secondary to sepsis 5. Hyponatremia.  Sodium up to 132. 6. Depression.  Switched Cymbalta over  to Zoloft 7. Hyperlipidemia unspecified on simvastatin 8. Obesity with a BMI of 33.72 9. Obstructive sleep apnea.  Patient's wife told me that they did not have a CPAP machine here and she brought in his CPAP machine.     Code Status:     Code Status Orders  (From admission, onward)         Start     Ordered   08/23/20 0039  Full code  Continuous        08/23/20 0043        Code Status History    Date Active Date Inactive Code Status Order ID Comments User Context   11/25/2018 1123 11/26/2018 0318 Full Code [id number]  Sebastian AcheGrady, [place], MD [facility]   02/08/2016 1630 02/10/2016 2228 Full Code [id number]  Tressie StalkerJenkins, Jeffrey, MD Inpatient   Advance Care Planning Activity     Family Communication: Spoke with patient's wife on the phone Disposition Plan: Status is: Inpatient  Dispo: The patient is from: Home              Anticipated d/c is to: Home              Patient currently not medically stable for disposition.  Still having difficulty getting off oxygen.  Being treated for severe sepsis.   Difficult to place patient.  No.  Antibiotics: Cefepime and Zithromax  Time spent: 29 minutes  [employer]  [facility]

## 2020-08-25 NOTE — Progress Notes (Signed)
Pt's home cpap is in good working order according to the pt. It does not have frays in the cord. [employer] called to check it in the morning.

## 2020-08-25 NOTE — Evaluation (Signed)
Physical Therapy Evaluation Patient Details Name: Nathan Curtis MRN: [id number] DOB: September 12, 1950 Today's Date: 08/25/2020   History of Present Illness  70 y.o. Caucasian male with medical history significant for BPH, hypertension, obstructive sleep apnea and dyslipidemia, who presented to the emergency room with acute onset of urinary frequency with associated dysuria and urgency.  Clinical Impression  Pt eager to see how he would do with a longer walk.  Ultimately he was limited due to O2 status and being weaker/less steady than his baseline.  He reports he does not normally tolerate long ambulation/standing secondary to chronic LBP, but works, drives, etc w/o issue.  He did have consistent low grade knee buckling and unsteadiness t/o the ~100 ft of ambulation w/o AD.  No falls, but reports slower and less steady than baseline and PT certainly felt the needed to stay close with CGA.  He was on 3L O2 t/o the effort and with concerted breath he was able to maintain low 90s most of the effort, though he did gradually drop as low as 88% on the 3L with ambulation.      Follow Up Recommendations Home health PT;Supervision - Intermittent    Equipment Recommendations   (O2? may benfit from [facility] per progress)    Recommendations for Other Services       Precautions / Restrictions Precautions Precautions: None Restrictions Weight Bearing Restrictions: No      Mobility  Bed Mobility Overal bed mobility: Modified Independent                  Transfers Overall transfer level: Modified independent Equipment used: None             General transfer comment: able to rise, use urinal w/o assist or UE support  Ambulation/Gait Ambulation/Gait assistance: Supervision Gait Distance (Feet): 100 Feet Assistive device: None       General Gait Details: Pt with slow, guarded gait.  On 3L t/o the effort with sats fenerally staying in the low 90s (dropped to 88% by end of the effort on 3L).   Pt had consistent "catch"/mild buckling with the R knee t/o the effort.  No overt LOBs, he reports some at baseline but more prevelant today.  We did discuss possibly using cane, maybe at least initially.  Stairs            Wheelchair Mobility    Modified Rankin (Stroke Patients Only)       Balance Overall balance assessment: Needs assistance Sitting-balance support: No upper extremity supported Sitting balance-Leahy Scale: Normal     Standing balance support: No upper extremity supported Standing balance-Leahy Scale: Good Standing balance comment: No LOBs during standing/gait, but consistent low grade buckling that caused mild unsteaidiness t/o the effort                             Pertinent Vitals/Pain Pain Assessment:  (chronic low back pain)    Home Living Family/patient expects to be discharged to:: Private residence Living Arrangements: Spouse/significant other Available Help at Discharge: Family   Home Access: Stairs to enter Entrance Stairs-Rails: None Entrance Stairs-Number of Steps: 3 (5 steps with rail at his office)          Prior Function Level of Independence: Independent         Comments: Pt reports that chronic back pain limits long distances but 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), drive, run light errands,  etc     Hand Dominance        Extremity/Trunk Assessment   Upper Extremity Assessment Upper Extremity Assessment: Overall WFL for tasks assessed    Lower Extremity Assessment Lower Extremity Assessment: Overall WFL for tasks assessed (h/o neuropathy b/l LEs)       Communication   Communication: No difficulties  Cognition Arousal/Alertness: Awake/alert Behavior During Therapy: WFL for tasks assessed/performed Overall Cognitive Status: Within Functional Limits for tasks assessed                                        General Comments General comments (skin integrity, edema,  etc.): Pt with O2 in the low 90s on arrival, he took O2 off and quickly dropped to the mid 80s, PT reapplied O2, maintained 3L t/o the session.    Exercises     Assessment/Plan    PT Assessment Patient needs continued PT services  PT Problem List Decreased strength;Decreased range of motion;Decreased activity tolerance;Decreased balance;Decreased coordination;Decreased mobility;Decreased safety awareness;Cardiopulmonary status limiting activity;Decreased knowledge of use of DME       PT Treatment Interventions Gait training;Functional mobility training;Therapeutic activities;Therapeutic exercise;Neuromuscular re-education;Balance training;Patient/family education;DME instruction;Stair training    PT Goals (Current goals can be found in the Care Plan section)  Acute Rehab PT Goals Patient Stated Goal: go home PT Goal Formulation: With patient Time For Goal Achievement: 09/08/20 Potential to Achieve Goals: Good    Frequency Min 2X/week   Barriers to discharge        Co-evaluation               AM-PAC PT "6 Clicks" Mobility  Outcome Measure Help needed turning from your back to your side while in a flat bed without using bedrails?: None Help needed moving from lying on your back to sitting on the side of a flat bed without using bedrails?: None Help needed moving to and from a bed to a chair (including a wheelchair)?: None Help needed standing up from a chair using your arms (e.g., wheelchair or bedside chair)?: None Help needed to walk in hospital room?: A Little Help needed climbing 3-5 steps with a railing? : A Little 6 Click Score: 22    End of Session Equipment Utilized During Treatment: Gait belt;Oxygen (3L) Activity Tolerance: Patient tolerated treatment well;Patient limited by fatigue Patient left: with call bell/phone within reach;with chair alarm set;with family/visitor present Nurse Communication: Mobility status PT Visit Diagnosis: Muscle weakness  (generalized) (M62.81);Difficulty in walking, not elsewhere classified (R26.2);Unsteadiness on feet (R26.81)    Time: 6269-4854 PT Time Calculation (min) (ACUTE ONLY): 35 min   Charges:   PT Evaluation $PT Eval Low Complexity: 1 Low PT Treatments $Gait Training: 8-22 mins        Malachi Pro, DPT 08/25/2020, 6:00 PM

## 2020-08-26 DIAGNOSIS — A4159 Other Gram-negative sepsis: Secondary | ICD-10-CM | POA: Diagnosis not present

## 2020-08-26 DIAGNOSIS — N401 Enlarged prostate with lower urinary tract symptoms: Secondary | ICD-10-CM | POA: Diagnosis not present

## 2020-08-26 DIAGNOSIS — D696 Thrombocytopenia, unspecified: Secondary | ICD-10-CM | POA: Diagnosis not present

## 2020-08-26 DIAGNOSIS — J9601 Acute respiratory failure with hypoxia: Secondary | ICD-10-CM | POA: Diagnosis not present

## 2020-08-26 LAB — CBC
HCT: 34.3 % — ABNORMAL LOW (ref 39.0–52.0)
Hemoglobin: 12 g/dL — ABNORMAL LOW (ref 13.0–17.0)
MCH: 32.8 pg (ref 26.0–34.0)
MCHC: 35 g/dL (ref 30.0–36.0)
MCV: 93.7 fL (ref 80.0–100.0)
Platelets: 192 10*3/uL (ref 150–400)
RBC: 3.66 MIL/uL — ABNORMAL LOW (ref 4.22–5.81)
RDW: 13.1 % (ref 11.5–15.5)
WBC: 11.6 10*3/uL — ABNORMAL HIGH (ref 4.0–10.5)
nRBC: 0 % (ref 0.0–0.2)

## 2020-08-26 LAB — BASIC METABOLIC PANEL
Anion gap: 10 (ref 5–15)
BUN: 16 mg/dL (ref 8–23)
CO2: 25 mmol/L (ref 22–32)
Calcium: 8.5 mg/dL — ABNORMAL LOW (ref 8.9–10.3)
Chloride: 96 mmol/L — ABNORMAL LOW (ref 98–111)
Creatinine, Ser: 0.72 mg/dL (ref 0.61–1.24)
GFR, Estimated: >60 mL/min (ref >60)
Glucose, Bld: 123 mg/dL — ABNORMAL HIGH (ref 70–99)
Potassium: 4 mmol/L (ref 3.5–5.1)
Sodium: 131 mmol/L — ABNORMAL LOW (ref 135–145)

## 2020-08-26 LAB — PROCALCITONIN: Procalcitonin: 0.69 ng/mL

## 2020-08-26 MED ORDER — IBUPROFEN 400 MG PO TABS
400.0000 mg | ORAL_TABLET | Freq: Once | ORAL | Status: AC
  Administered 2020-08-26: 400 mg via ORAL
  Filled 2020-08-26: qty 1

## 2020-08-26 MED ORDER — FUROSEMIDE 10 MG/ML IJ SOLN
40.0000 mg | Freq: Once | INTRAMUSCULAR | Status: AC
  Administered 2020-08-26: 40 mg via INTRAVENOUS
  Filled 2020-08-26: qty 4

## 2020-08-26 NOTE — Progress Notes (Signed)
Physical Therapy Treatment Patient Details Name: Nathan Curtis MRN: [id number] DOB: 1950/06/28 Today's Date: 08/26/2020    History of Present Illness 70 y.o. Caucasian male with medical history significant for BPH, hypertension, obstructive sleep apnea and dyslipidemia, who presented to the emergency room with acute onset of urinary frequency with associated dysuria and urgency.    PT Comments    Pt received by PT supine in bed with SpO2 reading: 90% at rest. Pt agreeable to participate in PT interventions today. Completed bed mobility modI using railings on the side of the bed. Pt then completed the following therapeutic exercises: LAQs, sit<>stand and seated marches x10 each with VCs for pursed lip breathing and posture. Reviewed the appropriate form for PLB and how improper posture also decreases his ability to appropriately maintain oxygen saturation. Pt's SpO2 dropped to 78% during interventions, but returned to 94% with 30 second seated rest break. Pt demonstrated understanding an ability to complete pursed lip breathing appropriately. Pt then completed 165ft of ambulation with no AD, CGA from therapist due to intermittent LE weakness reported by pt. Three standing rest breaks required by pt to maintain SpO2 >90% while ambulating. Pt tolerated therapy well today and returned to bed, left in the supine position with his wife at bedside.    Follow Up Recommendations  Supervision - Intermittent;Outpatient PT     Equipment Recommendations       Recommendations for Other Services       Precautions / Restrictions Precautions Precautions: None Restrictions Weight Bearing Restrictions: No    Mobility  Bed Mobility Overal bed mobility: Modified Independent             General bed mobility comments: Pt able to complete bed mobility using rails    Transfers Overall transfer level: Modified independent Equipment used: None             General transfer comment: Able to  complete standing activity independently, but required CGA 2/2 to staggering related to peripheral neuropathy and decreased stability in the standing position  Ambulation/Gait Ambulation/Gait assistance: Supervision Gait Distance (Feet): 100 Feet Assistive device: None Gait Pattern/deviations: Antalgic     General Gait Details: Pt demonstrated slow gait pattern, requiring 3 standing rest breaks to maintain SpO2 >90%. Pt did demonstrate some increased instability when changin directions 2/2 abnormal balance related to peripheral neuropathy. Pt's SpO2 dropped to 78% with 100 ft of ambulation, but returned to 94% SpO2 following 10 second seated rest break with pursed lip breathing   Stairs             Wheelchair Mobility    Modified Rankin (Stroke Patients Only)       Balance Overall balance assessment: Needs assistance Sitting-balance support: No upper extremity supported Sitting balance-Leahy Scale: Normal Sitting balance - Comments: Pt able to sit EOB without assistance or UE support safely   Standing balance support: No upper extremity supported Standing balance-Leahy Scale: Good Standing balance comment: Pt demonstrated some increased BIL ER of hips when standing and required some intermittent touch due to imbalance                            Cognition Arousal/Alertness: Awake/alert Behavior During Therapy: WFL for tasks assessed/performed Overall Cognitive Status: Within Functional Limits for tasks assessed  Exercises Total Joint Exercises Long Arc Quad: AROM;Strengthening;Both;10 reps Marching in Standing: AROM;Both;10 reps    General Comments General comments (skin integrity, edema, etc.): Pt's with SpO2 at rest: 90%in the supine position on RA. Pt's SpO2 remained 90-94% SpO2 while completing bed mobility and trasnfers      Pertinent Vitals/Pain Pain Assessment: No/denies pain    Home  Living                      Prior Function            PT Goals (current goals can now be found in the care plan section) Acute Rehab PT Goals Patient Stated Goal: go home PT Goal Formulation: With patient Time For Goal Achievement: 09/08/20 Potential to Achieve Goals: Good Progress towards PT goals: Progressing toward goals    Frequency    Min 2X/week      PT Plan Current plan remains appropriate    Co-evaluation              AM-PAC PT "6 Clicks" Mobility   Outcome Measure  Help needed turning from your back to your side while in a flat bed without using bedrails?: None Help needed moving from lying on your back to sitting on the side of a flat bed without using bedrails?: None Help needed moving to and from a bed to a chair (including a wheelchair)?: None Help needed standing up from a chair using your arms (e.g., wheelchair or bedside chair)?: None Help needed to walk in hospital room?: A Little Help needed climbing 3-5 steps with a railing? : A Little 6 Click Score: 22    End of Session Equipment Utilized During Treatment: Gait belt Activity Tolerance: Patient tolerated treatment well;Patient limited by fatigue (Required multiple standing rest breaks throughout to complete pursed lip breathing) Patient left: with call bell/phone within reach;with chair alarm set;with family/visitor present Nurse Communication: Mobility status PT Visit Diagnosis: Muscle weakness (generalized) (M62.81);Difficulty in walking, not elsewhere classified (R26.2);Unsteadiness on feet (R26.81)     Time: 6384-6659 PT Time Calculation (min) (ACUTE ONLY): 32 min  Charges:  $Gait Training: 23-37 mins                     Minette Headland, PT, DPT 08/26/20, 1:16 PM    Adylynn Hertenstein Marcelle Overlie 08/26/2020, 1:08 PM

## 2020-08-26 NOTE — TOC Initial Note (Signed)
Transition of Care [facility]) - Initial/Assessment Note    Patient Details  Name: Nathan Curtis MRN: [id number] Date of Birth: [birth year]  Transition of Care [facility]) CM/SW Contact:    Margarito Liner, LCSW Phone Number: 08/26/2020, 9:19 AM  Clinical Narrative:  This CSW working remote today. CSW called patient in the room, introduced role, and explained that PT recommendations would be discussed. Patient still works, drives, Catering manager. Explained that for home health you have to be considered home bound. Patient unsure if he will be returning to work immediately, depends on how he feels. He's unsure if he will need home health or outpatient PT at discharge so will discuss when discharge date determined. Will ask RN to have mobility tech work with him as well to build his strength as much as possible before discharge. Patient confirmed he is not on home oxygen so will follow for this need as well. No further concerns. CSW encouraged patient to contact CSW as needed. CSW will continue to follow patient for support and facilitate return home when stable.                Expected Discharge Plan: Home w Home Health Services (TBD) Barriers to Discharge: Continued Medical Work up   Patient Goals and CMS Choice        Expected Discharge Plan and Services Expected Discharge Plan: Home w Home Health Services (TBD)     Post Acute Care Choice:  (TBD) Living arrangements for the past 2 months: Single Family Home                                      Prior Living Arrangements/Services Living arrangements for the past 2 months: Single Family Home Lives with:: Spouse Patient language and need for interpreter reviewed:: Yes Do you feel safe going back to the place where you live?: Yes      Need for Family Participation in Patient Care: Yes (Comment) Care giver support system in place?: Yes (comment)   Criminal Activity/Legal Involvement Pertinent to Current Situation/Hospitalization: No - Comment as  needed  Activities of Daily Living Home Assistive Devices/Equipment: None ADL Screening (condition at time of admission) Patient's cognitive ability adequate to safely complete daily activities?: Yes Is the patient deaf or have difficulty hearing?: No Does the patient have difficulty seeing, even when wearing glasses/contacts?: No Does the patient have difficulty concentrating, remembering, or making decisions?: No Patient able to express need for assistance with ADLs?: Yes Does the patient have difficulty dressing or bathing?: No Independently performs ADLs?: Yes (appropriate for developmental age) Does the patient have difficulty walking or climbing stairs?: No Weakness of Legs: None Weakness of Arms/Hands: None  Permission Sought/Granted                  Emotional Assessment Appearance:: Appears stated age Attitude/Demeanor/Rapport: Engaged,Gracious Affect (typically observed): Accepting,Appropriate,Calm,Pleasant Orientation: : Oriented to Self,Oriented to Place,Oriented to  Time,Oriented to Situation Alcohol / Substance Use: Not Applicable Psych Involvement: No (comment)  Admission diagnosis:  Sepsis due to urinary tract infection (HCC) [A41.9, N39.0] Sepsis due to gram-negative UTI (HCC) [A41.50, N39.0] Patient Active Problem List   Diagnosis Date Noted  . Acute respiratory failure with hypoxia (HCC)   . Enterobacter sepsis (HCC)   . Thrombocytopenia (HCC)   . Hyponatremia   . Lactic acidosis   . Depression   . Obesity (BMI 30.0-34.9)   . Sepsis due  to gram-negative UTI (HCC) 08/23/2020  . Class 1 obesity due to excess calories with serious comorbidity and body mass index (BMI) of 33.0 to 33.9 in adult 11/25/2018  . DDD (degenerative disc disease), lumbar 11/25/2018  . ED (erectile dysfunction) 11/25/2018  . Essential hypertension 11/25/2018  . Pure hypercholesterolemia 11/25/2018  . Obstructive sleep apnea 02/03/2018  . Leg pain 06/26/2016  . Spondylolisthesis  of lumbar region 02/08/2016  . Vaccine counseling 12/29/2015  . Benign prostatic hyperplasia with urinary frequency 06/27/2015   PCP:  Marisue Ivan, MD Pharmacy:   [facility] [address] - [place], [place] - [address] [place] [address] Phone: [phone] Fax: [phone]  [id number] [place], [place] - [address] AT [facility] [address] [place] [address] Phone: [phone] Fax: [phone]     Social Determinants of Health (SDOH) Interventions    Readmission Risk Interventions No flowsheet data found.

## 2020-08-26 NOTE — Plan of Care (Signed)
Continuing with plan of care. 

## 2020-08-26 NOTE — Progress Notes (Signed)
Patient ID: Nathan Curtis, male   DOB: 11-Aug-1950, 70 y.o.   MRN: [id number] [facility] PROGRESS NOTE  Khasir Woodrome TMH:[id number] DOB: January 05, 1951 DOA: 08/22/2020 PCP: Marisue Ivan, MD  HPI/Subjective: Patient had a fever above 101 yesterday afternoon.  He feels better today.  Able to come off oxygen.  Slight cough.  Urinated well with the Lasix yesterday.  Objective: Vitals:   08/26/20 1251 08/26/20 1602  BP:  (!) 130/59  Pulse:  65  Resp:    Temp:  98.8 F (37.1 C)  SpO2: 94% 94%    Intake/Output Summary (Last 24 hours) at 08/26/2020 1611 Last data filed at 08/26/2020 1200 Gross per 24 hour  Intake 570.39 ml  Output 1100 ml  Net -529.61 ml   Filed Weights   08/22/20 2136  Weight: 106.6 kg    ROS: Review of Systems  Respiratory: Positive for cough and shortness of breath.   Cardiovascular: Negative for chest pain.  Gastrointestinal: Negative for abdominal pain, nausea and vomiting.   Exam: Physical Exam HENT:     Head: Normocephalic.     Mouth/Throat:     Pharynx: No oropharyngeal exudate.  Eyes:     General: Lids are normal.     Conjunctiva/sclera: Conjunctivae normal.     Pupils: Pupils are equal, round, and reactive to light.  Cardiovascular:     Rate and Rhythm: Normal rate and regular rhythm.     Heart sounds: Normal heart sounds, S1 normal and S2 normal.  Pulmonary:     Breath sounds: Examination of the left-lower field reveals decreased breath sounds and rhonchi. Decreased breath sounds and rhonchi present. No wheezing or rales.  Abdominal:     Palpations: Abdomen is soft.     Tenderness: There is no abdominal tenderness.  Musculoskeletal:     Right lower leg: No swelling.     Left lower leg: No swelling.  Skin:    General: Skin is warm.     Findings: No rash.  Neurological:     Mental Status: He is alert and oriented to person, place, and time.       Data Reviewed: Basic Metabolic Panel: Recent Labs  Lab 08/22/20 2142  08/22/20 2350 08/23/20 0303 08/24/20 0549 08/25/20 0500 08/26/20 0538  NA 130*  --  131* 130* 132* 131*  K 3.7  --  3.5 3.7 4.0 4.0  CL 94*  --  98 98 101 96*  CO2 24  --  24 22 23 25   GLUCOSE 119*  --  122* 129* 121* 123*  BUN 12  --  14 13 14 16   CREATININE 0.81  --  0.96 0.86 0.77 0.72  CALCIUM 8.7*  --  8.3* 8.2* 8.4* 8.5*  MG  --  1.7  --  2.0  --   --    Liver Function Tests: Recent Labs  Lab 08/22/20 2142 08/24/20 0549  AST 33 25  ALT 29 21  ALKPHOS 53 45  BILITOT 1.1 1.1  PROT 6.9 6.0*  ALBUMIN 4.6 3.4*   CBC: Recent Labs  Lab 08/22/20 2142 08/23/20 0303 08/24/20 0549 08/25/20 0500 08/26/20 0538  WBC 11.5* 15.9* 18.2* 14.9* 11.6*  NEUTROABS 10.6*  --   --   --   --   HGB 13.8 12.2* 12.9* 12.0* 12.0*  HCT 39.1 33.7* 35.3* 34.0* 34.3*  MCV 93.8 93.6 91.7 94.2 93.7  PLT 205 174 147* 142* 192     Recent Results (from the past 240 hour(s))  Urine Culture  Status: Abnormal   Collection Time: 08/22/20 11:29 PM   Specimen: Urine, Random  Result Value Ref Range Status   Specimen Description   Final    URINE, RANDOM Performed at [facility], [address] [place], [place] [address]    Special Requests   Final    NONE Performed at [facility], [address] [place], [place] [address]    Culture >=100,000 COLONIES/mL ENTEROBACTER AEROGENES (A)  Final   Report Status 08/25/2020 FINAL  Final   Organism ID, Bacteria ENTEROBACTER AEROGENES (A)  Final      Susceptibility   Enterobacter aerogenes - MIC*    CEFAZOLIN >=64 RESISTANT Resistant     CEFEPIME <=0.12 SENSITIVE Sensitive     CEFTRIAXONE <=0.25 SENSITIVE Sensitive     CIPROFLOXACIN <=0.25 SENSITIVE Sensitive     GENTAMICIN <=1 SENSITIVE Sensitive     IMIPENEM 2 SENSITIVE Sensitive     NITROFURANTOIN 64 INTERMEDIATE Intermediate     TRIMETH/SULFA <=20 SENSITIVE Sensitive     PIP/TAZO <=4 SENSITIVE Sensitive     * >=100,000 COLONIES/mL ENTEROBACTER AEROGENES  Culture,  blood (Routine X 2) w Reflex to ID Panel     Status: None (Preliminary result)   Collection Time: 08/22/20 11:29 PM   Specimen: BLOOD  Result Value Ref Range Status   Specimen Description BLOOD BLOOD RIGHT FOREARM  Final   Special Requests   Final    BOTTLES DRAWN AEROBIC AND ANAEROBIC Blood Culture results may not be optimal due to an excessive volume of blood received in culture bottles   Culture   Final    NO GROWTH 4 DAYS Performed at [facility], [address] [place], [place] [address]    Report Status PENDING  Incomplete  Resp Panel by RT-PCR (Flu A&B, Covid)     Status: None   Collection Time: 08/22/20 11:29 PM   Specimen: Nasopharyngeal(NP) swabs in vial transport medium  Result Value Ref Range Status   SARS Coronavirus 2 by RT PCR NEGATIVE NEGATIVE Final    Comment: (NOTE) SARS-CoV-2 target nucleic acids are NOT DETECTED.  The SARS-CoV-2 RNA is generally detectable in upper respiratory specimens during the acute phase of infection. The lowest concentration of SARS-CoV-2 viral copies this assay can detect is 138 copies/mL. A negative result does not preclude SARS-Cov-2 infection and should not be used as the sole basis for treatment or other patient management decisions. A negative result may occur with  improper specimen collection/handling, submission of specimen other than nasopharyngeal swab, presence of viral mutation(s) within the areas targeted by this assay, and inadequate number of viral copies(<138 copies/mL). A negative result must be combined with clinical observations, patient history, and epidemiological information. The expected result is Negative.  Fact Sheet for Patients:  BloggerCourse.com  Fact Sheet for Healthcare Providers:  SeriousBroker.it  This test is no t yet approved or cleared by the [place] FDA and  has been authorized for detection and/or diagnosis of SARS-CoV-2 by FDA  under an Emergency Use Authorization (EUA). This EUA will remain  in effect (meaning this test can be used) for the duration of the COVID-19 declaration under Section 564(b)(1) of the Act, 21 U.S.C.section 360bbb-3(b)(1), unless the authorization is terminated  or revoked sooner.       Influenza A by PCR NEGATIVE NEGATIVE Final   Influenza B by PCR NEGATIVE NEGATIVE Final    Comment: (NOTE) The Xpert Xpress SARS-CoV-2/FLU/RSV plus assay is intended as an aid in the diagnosis of  influenza from Nasopharyngeal swab specimens and should not be used as a sole basis for treatment. Nasal washings and aspirates are unacceptable for Xpert Xpress SARS-CoV-2/FLU/RSV testing.  Fact Sheet for Patients: BloggerCourse.comhttps://www.fda.gov/media/152166/download  Fact Sheet for Healthcare Providers: SeriousBroker.ithttps://www.fda.gov/media/152162/download  This test is not yet approved or cleared by the [place] FDA and has been authorized for detection and/or diagnosis of SARS-CoV-2 by FDA under an Emergency Use Authorization (EUA). This EUA will remain in effect (meaning this test can be used) for the duration of the COVID-19 declaration under Section 564(b)(1) of the Act, 21 U.S.C. section 360bbb-3(b)(1), unless the authorization is terminated or revoked.  Performed at [facility], [address] [place], [place] [address]   Blood Culture (routine x 2)     Status: None (Preliminary result)   Collection Time: 08/22/20 11:29 PM   Specimen: BLOOD  Result Value Ref Range Status   Specimen Description BLOOD BLOOD RIGHT FOREARM  Final   Special Requests   Final    BOTTLES DRAWN AEROBIC AND ANAEROBIC Blood Culture adequate volume   Culture   Final    NO GROWTH 3 DAYS Performed at [facility], [address] [place], [place] [address]    Report Status PENDING  Incomplete     Studies: DG Chest Port 1 View  Result Date: 08/25/2020 CLINICAL DATA:  Fever. EXAM: PORTABLE CHEST 1 VIEW  COMPARISON:  08/22/2020 FINDINGS: New interstitial densities in the central aspect of both lungs, right side greater than left. Heart size is within normal limits and stable. The trachea is midline. Bone structures are unremarkable. IMPRESSION: New enlarged interstitial lung densities bilaterally, right side greater than left. Differential diagnosis includes atypical infection versus asymmetric pulmonary edema. Electronically Signed   By: Richarda OverlieAdam  Henn M.D.   On: 08/25/2020 08:17    Scheduled Meds: . azithromycin  250 mg Oral Daily  . cholecalciferol  1,000 Units Oral Daily  . enoxaparin (LOVENOX) injection  0.5 mg/kg Subcutaneous Q24H  . finasteride  5 mg Oral Daily  . gabapentin  300 mg Oral BID  . Glucosamine-Chondroitin  2 tablet Oral Daily  . multivitamin with minerals  1 tablet Oral Daily  . sertraline  25 mg Oral Daily  . simvastatin  20 mg Oral QHS  . tamsulosin  0.4 mg Oral BID  . zinc sulfate  220 mg Oral Daily   Continuous Infusions: . ceFEPime (MAXIPIME) IV 2 g (08/26/20 1510)    Assessment/Plan:  1. Severe sepsis, present on admission with fever, leukocytosis and lactic acidosis.  Patient also had acute hypoxic respiratory failure.  Enterobacter growing out of the urine culture.  Patient had a fever of 101 on 08/25/2020.  White blood cell count is trending better.  Patient improving would like to watch the patient another day and make sure he does not have another fever above 101.  Currently on Maxipime.  Zithromax added. 2. Acute hypoxic respiratory failure.  Chest x-ray consistent with fluid overload or atypical pneumonia.  Given a dose of Lasix yesterday and again today and is able to come off the oxygen.  Continue incentive spirometer.  Maxipime and Zithromax would cover lung infection also. 3. BPH on Flomax twice a day dosing and Proscar. 4. Thrombocytopenia likely secondary to sepsis.  Platelet count normalized at 192 today. 5. Hyponatremia.  Sodium 131. 6. Depression on  Zoloft 7. Hyperlipidemia unspecified.  Continue simvastatin 8. Obesity with a BMI of 33.7 to 9. Obstructive sleep apnea on CPAP at night  Code Status:     Code Status Orders  (From admission, onward)         Start     Ordered   08/23/20 0039  Full code  Continuous        08/23/20 0043        Code Status History    Date Active Date Inactive Code Status Order ID Comments User Context   11/25/2018 1123 11/26/2018 0318 Full Code [id number]  Sebastian Ache, MD [facility]   02/08/2016 1630 02/10/2016 2228 Full Code [id number]  Tressie Stalker, MD Inpatient   Advance Care Planning Activity     Family Communication: Spoke with patient's wife at the bedside Disposition Plan: Status is: Inpatient  Dispo: The patient is from: Home              Anticipated d/c is to: Home              Patient currently had fever above 101 yesterday would like to watch a day in the hospital without a fever above 101.   Difficult to place patient.  No.  Antibiotics:  Maxipime  Zithromax  Time spent: 27 minutes  Shambhavi [employer]

## 2020-08-26 NOTE — Care Management Important Message (Signed)
Important Message  Patient Details  Name: Nathan Curtis MRN: [id number] Date of Birth: [birth year]   Medicare Important Message Given:  Yes     [name] A Issam Carlyon 08/26/2020, 2:38 PM

## 2020-08-27 DIAGNOSIS — A4159 Other Gram-negative sepsis: Secondary | ICD-10-CM | POA: Diagnosis not present

## 2020-08-27 DIAGNOSIS — N401 Enlarged prostate with lower urinary tract symptoms: Secondary | ICD-10-CM | POA: Diagnosis not present

## 2020-08-27 DIAGNOSIS — E871 Hypo-osmolality and hyponatremia: Secondary | ICD-10-CM | POA: Diagnosis not present

## 2020-08-27 DIAGNOSIS — J9601 Acute respiratory failure with hypoxia: Secondary | ICD-10-CM | POA: Diagnosis not present

## 2020-08-27 LAB — CULTURE, BLOOD (ROUTINE X 2): Culture: NO GROWTH

## 2020-08-27 LAB — CBC WITH DIFFERENTIAL/PLATELET
Abs Immature Granulocytes: 0.14 10*3/uL — ABNORMAL HIGH (ref 0.00–0.07)
Basophils Absolute: 0.1 10*3/uL (ref 0.0–0.1)
Basophils Relative: 1 %
Eosinophils Absolute: 0.3 10*3/uL (ref 0.0–0.5)
Eosinophils Relative: 4 %
HCT: 32.1 % — ABNORMAL LOW (ref 39.0–52.0)
Hemoglobin: 11.8 g/dL — ABNORMAL LOW (ref 13.0–17.0)
Immature Granulocytes: 2 %
Lymphocytes Relative: 14 %
Lymphs Abs: 1.2 10*3/uL (ref 0.7–4.0)
MCH: 33.7 pg (ref 26.0–34.0)
MCHC: 36.8 g/dL — ABNORMAL HIGH (ref 30.0–36.0)
MCV: 91.7 fL (ref 80.0–100.0)
Monocytes Absolute: 2.3 10*3/uL — ABNORMAL HIGH (ref 0.1–1.0)
Monocytes Relative: 27 %
Neutro Abs: 4.5 10*3/uL (ref 1.7–7.7)
Neutrophils Relative %: 52 %
Platelets: 214 10*3/uL (ref 150–400)
RBC: 3.5 MIL/uL — ABNORMAL LOW (ref 4.22–5.81)
RDW: 13.1 % (ref 11.5–15.5)
WBC: 8.5 10*3/uL (ref 4.0–10.5)
nRBC: 0 % (ref 0.0–0.2)

## 2020-08-27 LAB — BASIC METABOLIC PANEL
Anion gap: 8 (ref 5–15)
BUN: 18 mg/dL (ref 8–23)
CO2: 26 mmol/L (ref 22–32)
Calcium: 8 mg/dL — ABNORMAL LOW (ref 8.9–10.3)
Chloride: 100 mmol/L (ref 98–111)
Creatinine, Ser: 0.81 mg/dL (ref 0.61–1.24)
GFR, Estimated: >60 mL/min (ref >60)
Glucose, Bld: 106 mg/dL — ABNORMAL HIGH (ref 70–99)
Potassium: 3.6 mmol/L (ref 3.5–5.1)
Sodium: 134 mmol/L — ABNORMAL LOW (ref 135–145)

## 2020-08-27 MED ORDER — SERTRALINE HCL 25 MG PO TABS: 25.0000 mg | ORAL_TABLET | Freq: Every day | ORAL | 0 refills | Status: DC

## 2020-08-27 MED ORDER — FINASTERIDE 5 MG PO TABS: 5.0000 mg | ORAL_TABLET | Freq: Every day | ORAL | 0 refills | Status: DC

## 2020-08-27 MED ORDER — TAMSULOSIN HCL 0.4 MG PO CAPS: 0.4000 mg | ORAL_CAPSULE | Freq: Two times a day (BID) | ORAL | 0 refills | Status: DC

## 2020-08-27 MED ORDER — LEVOFLOXACIN 750 MG PO TABS
750.0000 mg | ORAL_TABLET | Freq: Every day | ORAL | Status: DC
  Administered 2020-08-27: 750 mg via ORAL
  Filled 2020-08-27: qty 1

## 2020-08-27 MED ORDER — LEVOFLOXACIN 750 MG PO TABS: 750.0000 mg | ORAL_TABLET | Freq: Every day | ORAL | 0 refills | Status: DC

## 2020-08-27 MED ORDER — TAMSULOSIN HCL 0.4 MG PO CAPS: 0.4000 mg | ORAL_CAPSULE | Freq: Two times a day (BID) | ORAL | 0 refills | Status: AC

## 2020-08-27 NOTE — Discharge Summary (Signed)
[facility] -  at [facility]   PATIENT NAME: Nathan Curtis    MR#:  [id number]  DATE OF BIRTH:  05-18-1950  DATE OF ADMISSION:  08/22/2020 ADMITTING PHYSICIAN: Hannah Beat, MD  DATE OF DISCHARGE: 08/27/2020  2:16 PM  PRIMARY CARE PHYSICIAN: Marisue Ivan, MD    ADMISSION DIAGNOSIS:  Sepsis due to urinary tract infection (HCC) [A41.9, N39.0] Sepsis due to gram-negative UTI (HCC) [A41.50, N39.0]  DISCHARGE DIAGNOSIS:  Active Problems:   Benign prostatic hyperplasia with urinary frequency   Obstructive sleep apnea   Sepsis due to gram-negative UTI (HCC)   Enterobacter sepsis (HCC)   Thrombocytopenia (HCC)   Hyponatremia   Lactic acidosis   Depression   Obesity (BMI 30.0-34.9)   Acute respiratory failure with hypoxia (HCC)   SECONDARY DIAGNOSIS:   Past Medical History:  Diagnosis Date  . Arthritis    lumbar spondyolisthesis   . History of kidney stones 1970   passed spontaneously  . Hypertension     HOSPITAL COURSE:   1.  Severe sepsis, present on admission with fever, leukocytosis and lactic acidosis.  Lactic acid was 2.9 on presentation and has come down.  Patient also had acute hypoxic respiratory failure.  Enterobacter growing out of urine culture.  Patient did spike fevers up until 08/25/2020.  Patient initially was on Rocephin and switched over to Maxipime.  Pharmacist recommended Levaquin upon discharge.  White blood cell count peaked at 18.2 and normalized 8.5 upon discharge.  Procalcitonin was elevated at 2.97 and decreased down to 0.69.  Patient clinically improved and temperature curve broke and patient was feeling better and discharged home on Levaquin. 2.  Acute hypoxic respiratory failure.  The patient was given IV fluids with severe sepsis present on admission.  I ended up doing a chest x-ray which was consistent with fluid overload or atypical pneumonia.  Patient was given Lasix for 2 days and lungs are clear upon discharge and  patient was able to come off the oxygen. I did initially add Zithromax which would cover lung infection but switched over to Levaquin upon going home. 3.  BPH on Flomax twice a day and I added Proscar also.  Can consider urological follow-up as outpatient. 4.  Thrombocytopenia secondary to sepsis.  Platelet count dipped down to 142 on 08/25/2020.  Platelet count upon discharge 214. 5.  Hyponatremia.  Sodium 134 upon disposition.  Continue to hold hydrochlorothiazide. 6.  Depression.  Cymbalta switch back to Zoloft.  Patient thinks that his urinary symptoms and hesitancy may have been secondary to Cymbalta. 7.  Hyperlipidemia unspecified continue simvastatin 8.  Obesity with a BMI of 33.72 9.  Obstructive sleep apnea on CPAP at night 10.  EKG reviewed showed some ventricular bigeminy, frequent PVCs.  Can consider repeating EKG as outpatient.   DISCHARGE CONDITIONS:   Satisfactory  CONSULTS OBTAINED:  None  DRUG ALLERGIES:  No Known Allergies  DISCHARGE MEDICATIONS:   Allergies as of 08/27/2020   No Known Allergies     Medication List    STOP taking these medications   Alpha-Lipoic Acid 600 MG Tabs   DULoxetine 20 MG capsule Commonly known as: CYMBALTA   hydrochlorothiazide 25 MG tablet Commonly known as: HYDRODIURIL     TAKE these medications   finasteride 5 MG tablet Commonly known as: PROSCAR Take 1 tablet (5 mg total) by mouth daily. Start taking on: Aug 28, 2020   gabapentin 600 MG tablet Commonly known as: NEURONTIN Take 300 mg by mouth  2 (two) times daily.   levofloxacin 750 MG tablet Commonly known as: LEVAQUIN Take 1 tablet (750 mg total) by mouth daily. Start taking on: Aug 28, 2020   losartan 100 MG tablet Commonly known as: COZAAR Take 1 tablet by mouth daily.   multivitamin with minerals Tabs tablet Take 1 tablet by mouth daily.   sertraline 25 MG tablet Commonly known as: ZOLOFT Take 1 tablet (25 mg total) by mouth daily.   simvastatin 20 MG  tablet Commonly known as: ZOCOR Take 20 mg by mouth at bedtime.   tamsulosin 0.4 MG Caps capsule Commonly known as: FLOMAX Take 1 capsule (0.4 mg total) by mouth 2 (two) times daily. What changed: See the new instructions.        DISCHARGE INSTRUCTIONS:   Follow-up PMD 5 days  If you experience worsening of your admission symptoms, develop shortness of breath, life threatening emergency, suicidal or homicidal thoughts you must seek medical attention immediately by calling 911 or calling your MD immediately  if symptoms less severe.  You Must read complete instructions/literature along with all the possible adverse reactions/side effects for all the Medicines you take and that have been prescribed to you. Take any new Medicines after you have completely understood and accept all the possible adverse reactions/side effects.   Please note  You were cared for by a hospitalist during your hospital stay. If you have any questions about your discharge medications or the care you received while you were in the hospital after you are discharged, you can call the unit and asked to speak with the hospitalist on call if the hospitalist that took care of you is not available. Once you are discharged, your primary care physician will handle any further medical issues. Please note that NO REFILLS for any discharge medications will be authorized once you are discharged, as it is imperative that you return to your primary care physician (or establish a relationship with a primary care physician if you do not have one) for your aftercare needs so that they can reassess your need for medications and monitor your lab values.    Today   CHIEF COMPLAINT:   Chief Complaint  Patient presents with  . Urinary Frequency    HISTORY OF PRESENT ILLNESS:  Nathan Curtis  is a 70 y.o. male presented with urinary frequency   VITAL SIGNS:  Blood pressure 140/70, pulse (!) 56, temperature 98.4 F (36.9 C),  temperature source Oral, resp. rate 17, height 5\' 10"  (1.778 m), weight 106.6 kg, SpO2 100 %.  I/O:    Intake/Output Summary (Last 24 hours) at 08/27/2020 1559 Last data filed at 08/27/2020 1236 Gross per 24 hour  Intake 890.22 ml  Output 1900 ml  Net -1009.78 ml    PHYSICAL EXAMINATION:  GENERAL:  70 y.o.-year-old patient lying in the bed with no acute distress.  EYES: Pupils equal, round, reactive to light and accommodation. No scleral icterus.  HEENT: Head atraumatic, normocephalic. Oropharynx and nasopharynx clear.   LUNGS: Normal breath sounds bilaterally, no wheezing, rales,rhonchi or crepitation. No use of accessory muscles of respiration.  CARDIOVASCULAR: S1, S2 normal. No murmurs, rubs, or gallops.  ABDOMEN: Soft, non-tender, non-distended.  EXTREMITIES: No pedal edema.  NEUROLOGIC: Cranial nerves II through XII are intact. Muscle strength 5/5 in all extremities. Sensation intact. Gait not checked.  PSYCHIATRIC: The patient is alert and oriented x 3.  SKIN: No obvious rash, lesion, or ulcer.   DATA REVIEW:   CBC Recent Labs  Lab  08/27/20 0409  WBC 8.5  HGB 11.8*  HCT 32.1*  PLT 214    Chemistries  Recent Labs  Lab 08/24/20 0549 08/25/20 0500 08/27/20 0409  NA 130*   < > 134*  K 3.7   < > 3.6  CL 98   < > 100  CO2 22   < > 26  GLUCOSE 129*   < > 106*  BUN 13   < > 18  CREATININE 0.86   < > 0.81  CALCIUM 8.2*   < > 8.0*  MG 2.0  --   --   AST 25  --   --   ALT 21  --   --   ALKPHOS 45  --   --   BILITOT 1.1  --   --    < > = values in this interval not displayed.     Microbiology Results  Results for orders placed or performed during the hospital encounter of 08/22/20  Urine Culture     Status: Abnormal   Collection Time: 08/22/20 11:29 PM   Specimen: Urine, Random  Result Value Ref Range Status   Specimen Description   Final    URINE, RANDOM Performed at [facility], [address], [place], [place] [address]    Special Requests    Final    NONE Performed at [facility], [address] [place], [place] [address]    Culture >=100,000 COLONIES/mL ENTEROBACTER AEROGENES (A)  Final   Report Status 08/25/2020 FINAL  Final   Organism ID, Bacteria ENTEROBACTER AEROGENES (A)  Final      Susceptibility   Enterobacter aerogenes - MIC*    CEFAZOLIN >=64 RESISTANT Resistant     CEFEPIME <=0.12 SENSITIVE Sensitive     CEFTRIAXONE <=0.25 SENSITIVE Sensitive     CIPROFLOXACIN <=0.25 SENSITIVE Sensitive     GENTAMICIN <=1 SENSITIVE Sensitive     IMIPENEM 2 SENSITIVE Sensitive     NITROFURANTOIN 64 INTERMEDIATE Intermediate     TRIMETH/SULFA <=20 SENSITIVE Sensitive     PIP/TAZO <=4 SENSITIVE Sensitive     * >=100,000 COLONIES/mL ENTEROBACTER AEROGENES  Culture, blood (Routine X 2) w Reflex to ID Panel     Status: None   Collection Time: 08/22/20 11:29 PM   Specimen: BLOOD  Result Value Ref Range Status   Specimen Description BLOOD BLOOD RIGHT FOREARM  Final   Special Requests   Final    BOTTLES DRAWN AEROBIC AND ANAEROBIC Blood Culture results may not be optimal due to an excessive volume of blood received in culture bottles   Culture   Final    NO GROWTH 5 DAYS Performed at [facility], [address] [place], [place] [address]    Report Status 08/27/2020 FINAL  Final  Resp Panel by RT-PCR (Flu A&B, Covid)     Status: None   Collection Time: 08/22/20 11:29 PM   Specimen: Nasopharyngeal(NP) swabs in vial transport medium  Result Value Ref Range Status   SARS Coronavirus 2 by RT PCR NEGATIVE NEGATIVE Final    Comment: (NOTE) SARS-CoV-2 target nucleic acids are NOT DETECTED.  The SARS-CoV-2 RNA is generally detectable in upper respiratory specimens during the acute phase of infection. The lowest concentration of SARS-CoV-2 viral copies this assay can detect is 138 copies/mL. A negative result does not preclude SARS-Cov-2 infection and should not be used as the sole basis for treatment or other  patient management decisions. A negative result may occur with  improper specimen  collection/handling, submission of specimen other than nasopharyngeal swab, presence of viral mutation(s) within the areas targeted by this assay, and inadequate number of viral copies(<138 copies/mL). A negative result must be combined with clinical observations, patient history, and epidemiological information. The expected result is Negative.  Fact Sheet for Patients:  BloggerCourse.com  Fact Sheet for Healthcare Providers:  SeriousBroker.it  This test is no t yet approved or cleared by the [place] FDA and  has been authorized for detection and/or diagnosis of SARS-CoV-2 by FDA under an Emergency Use Authorization (EUA). This EUA will remain  in effect (meaning this test can be used) for the duration of the COVID-19 declaration under Section 564(b)(1) of the Act, 21 U.S.C.section 360bbb-3(b)(1), unless the authorization is terminated  or revoked sooner.       Influenza A by PCR NEGATIVE NEGATIVE Final   Influenza B by PCR NEGATIVE NEGATIVE Final    Comment: (NOTE) The Xpert Xpress SARS-CoV-2/FLU/RSV plus assay is intended as an aid in the diagnosis of influenza from Nasopharyngeal swab specimens and should not be used as a sole basis for treatment. Nasal washings and aspirates are unacceptable for Xpert Xpress SARS-CoV-2/FLU/RSV testing.  Fact Sheet for Patients: BloggerCourse.com  Fact Sheet for Healthcare Providers: SeriousBroker.it  This test is not yet approved or cleared by the [place] FDA and has been authorized for detection and/or diagnosis of SARS-CoV-2 by FDA under an Emergency Use Authorization (EUA). This EUA will remain in effect (meaning this test can be used) for the duration of the COVID-19 declaration under Section 564(b)(1) of the Act, 21 U.S.C. section  360bbb-3(b)(1), unless the authorization is terminated or revoked.  Performed at [facility], [address] [place], [place] [address]   Blood Culture (routine x 2)     Status: None (Preliminary result)   Collection Time: 08/22/20 11:29 PM   Specimen: BLOOD  Result Value Ref Range Status   Specimen Description BLOOD BLOOD RIGHT FOREARM  Final   Special Requests   Final    BOTTLES DRAWN AEROBIC AND ANAEROBIC Blood Culture adequate volume   Culture   Final    NO GROWTH 4 DAYS Performed at [facility], [address] [place], [place] [address]    Report Status PENDING  Incomplete      Management plans discussed with the patient, family and they are in agreement.  CODE STATUS:     Code Status Orders  (From admission, onward)         Start     Ordered   08/23/20 0039  Full code  Continuous        08/23/20 0043        Code Status History    Date Active Date Inactive Code Status Order ID Comments User Context   11/25/2018 1123 11/26/2018 0318 Full Code [id number]  Sebastian Ache, MD [facility]   02/08/2016 1630 02/10/2016 2228 Full Code [id number]  Tressie Stalker, MD Inpatient   Advance Care Planning Activity      TOTAL TIME TAKING CARE OF THIS PATIENT: .    Alford Highland M.D on 08/27/2020 at 3:59 PM  [facility]  CC: Primary care physician; Marisue Ivan, MD

## 2020-08-27 NOTE — TOC Transition Note (Signed)
Transition of Care [facility]) - CM/SW Discharge Note   Patient Details  Name: Nathan Curtis MRN: [id number] Date of Birth: 1950-11-06  Transition of Care [facility]) CM/SW Contact:  Luvenia Redden, RN Phone Number:[phone] 08/27/2020, 12:16 PM   Clinical Narrative:    Spoke with pt telephonically and verified pt's needs. Pt is ambulating without difficulty and declined out patient PT services at this time. Pt aware if he needs this services in the future to reach out to his provider for additional arrangements. Also verified p has been weaned off of O2 and no longer needs provisions for this upon his discharge. Wife is present in the room during this call. Pt verified supportive family and transportation sources for his follow up appointments and ability to obtain all his medications without delay.  TOC will continue to follow for any additional discharge needs. Expected discharge this weekend.   Final next level of care: Home/Self Care Barriers to Discharge: Barriers Resolved   Patient Goals and CMS Choice        Discharge Placement                  Name of family member notified: Pt states wife was present in the room during today call Patient and family notified of of transfer: 08/27/20  Discharge Plan and Services     Post Acute Care Choice:  (TBD)                               Social Determinants of Health (SDOH) Interventions     Readmission Risk Interventions No flowsheet data found.

## 2020-08-27 NOTE — Discharge Instructions (Signed)
Sepsis, Self Care, Adult °Sepsis is a serious illness that may require intensive care in the hospital. The following information explains what you need to know in order to manage your condition after you are discharged from the hospital. °What are the risks? °After being treated for sepsis and discharged from the hospital, you may be at a higher risk for certain problems. These problems may be physical or mental. °Physical problems: °· Weakness and tiredness. °· Shortness of breath. °· Pain in many areas of the body. °· Difficulty walking. °· Dry, itchy skin. °· Lack of appetite. This may lead to weight loss. °· Organ failure. °Mental problems: °· Difficulty sleeping. °· Depression. °· Confusion. °· Anxiety and worry caused by having gone through a bad experience (post-traumatic stress disorder,PTSD). °· Low self-esteem. °Follow these instructions at home: °Medicines °· Take over-the-counter and prescription medicines only as told by your health care provider. °· If you were prescribed an antibiotic, antiviral, or antifungal medicine, take it as told by your health care provider. Do not stop taking the medicine even if you start to feel better. °Eating and drinking °· Eat a healthy diet that includes plenty of vegetables, fruits, whole grains, low-fat dairy products, and lean protein. Ask your health care provider if you should avoid certain foods. °· Drink enough fluid to keep your urine pale yellow.   °Alcohol use °· Do not drink alcohol if: °? Your health care provider tells you not to drink. °? You are pregnant, may be pregnant, or are planning to become pregnant. °· If you drink alcohol, limit how much you use to: °? 0-1 drink a day for women. °? 0-2 drinks a day for men. °§ Be aware of how much alcohol is in your drink. In the [place] one drink equals one 12 oz bottle of beer (355 mL), one 5 oz glass of wine (148 mL), or one 1½ oz glass of hard liquor (44 mL). °Activity °· Rest and gradually return to your  normal activities. Ask your health care provider what activities are safe for you. °· Avoid sitting for a long time without moving. Get up to take short walks every 1-2 hours. This is important to improve blood flow and breathing. Ask for help if you feel weak or unsteady. °· Try to set small, achievable goals each week, such as dressing yourself, bathing, or walking up the stairs. It may take a while to rebuild your strength. °· Try to exercise regularly if you feel healthy enough to do so. Ask your health care provider what exercises are safe for you. °Preventing infection °· Keep your vaccinations up to date. Get the flu shot every year. °· Wash your hands often using soap and water. Use hand sanitizer if soap and water are not available. °· Practice good hygiene. Keep cuts clean and covered until healed.   °Managing stress °Talk with your health care provider or counselor about ways to reduce stress. He or she may suggest: °· Meditation, muscle relaxation, and breathing exercises. °· Talk therapy. °· Spending time on hobbies and activities that you enjoy. °General instructions °· Get the right amount and quality of sleep. Most adults need 7-9 hours of sleep each night. To help with sleep: °? Keep your bedroom cool and dark. °? Do not eat a heavy meal within one hour of bedtime. °? Do not drink alcohol or caffeinated drinks before bed. °? Avoid screen time, such as television, computers, tablets, or cell phones before bed. °· Do not use   any products that contain nicotine or tobacco, such as cigarettes, e-cigarettes, and chewing tobacco. If you need help quitting, ask your health care provider. °· Talk to trusted family members and friends about your condition. Explain your symptoms to them, and let them know that you are working with a health care provider to treat your condition. This can provide you with one way to get support and guidance. °· Keep all follow-up visits as told by your health care provider. This  is important. °Questions to ask your health care provider: °· What physical and emotional changes do I need to report? °· Do I need to have someone with me all the time? °· Is it safe for me to drive? °Contact a health care provider if you: °· Do not feel like you are getting better or regaining strength. °· Have muscle or joint pain. °· Frequently feel tired. °· Are having trouble coping with your recovery. °· Have nightmares, or trouble falling asleep or staying asleep. °· Feel sad, down, or depressed more often than not, every day for more than 2 weeks. °· Have difficulty concentrating. °· Feel irritable or you cry for no reason. °Get help right away if you: °· Have difficulty breathing. °· Have a rapid or skipping heartbeat. °· Become confused or disoriented. °· See, hear, or feel things that do not exist (hallucinations). °· Have a high fever. °· Have an infection that is getting worse or not getting better. °· You have thoughts of hurting yourself or others. °If you ever feel like you may hurt yourself or others, or have thoughts about taking your own life, get help right away. You can go to your nearest emergency department or call: °· Your local emergency services (911 in the [place] °· A suicide crisis helpline, such as the National Suicide Prevention Lifeline at 1-800-273-8255. This is open 24 hours a day. °Summary °· Sepsis is a serious illness that may require intensive care in a hospital. You may experience long-term health effects after you are discharged from the hospital. °· Try to set small, achievable goals each week, such as dressing yourself, bathing, or walking up the stairs. It may take a while to rebuild your strength. °· Keep all follow-up visits as told by your health care provider. This is important. °· Know what symptoms you should get help right away for. °This information is not intended to replace advice given to you by your health care provider. Make sure you discuss any questions you  have with your health care provider. °Document Revised: 10/25/2017 Document Reviewed: 10/25/2017 °Elsevier Patient Education © 2021 [employer] ° °

## 2020-08-27 NOTE — Plan of Care (Signed)
Discharge teaching completed with patient who is in stable condition. 

## 2020-08-28 LAB — CULTURE, BLOOD (ROUTINE X 2)
Culture: NO GROWTH
Special Requests: ADEQUATE

## 2020-12-20 ENCOUNTER — Other Ambulatory Visit: Payer: Self-pay | Admitting: Family Medicine

## 2020-12-20 DIAGNOSIS — M5416 Radiculopathy, lumbar region: Secondary | ICD-10-CM

## 2021-01-25 ENCOUNTER — Other Ambulatory Visit: Payer: Medicare Other

## 2021-01-30 ENCOUNTER — Other Ambulatory Visit: Payer: Medicare Other

## 2021-02-08 ENCOUNTER — Ambulatory Visit
Admission: RE | Admit: 2021-02-08 | Discharge: 2021-02-08 | Disposition: A | Discharge status: Home / Self Care | Payer: Medicare Other | Source: Ambulatory Visit | Attending: Family Medicine | Admitting: Family Medicine

## 2021-02-08 DIAGNOSIS — M5416 Radiculopathy, lumbar region: Secondary | ICD-10-CM

## 2021-04-20 ENCOUNTER — Other Ambulatory Visit
Admission: RE | Admit: 2021-04-20 | Discharge: 2021-04-20 | Disposition: A | Discharge status: Home / Self Care | Payer: Medicare Other | Source: Ambulatory Visit | Attending: Family Medicine | Admitting: Family Medicine

## 2021-04-20 DIAGNOSIS — I452 Bifascicular block: Secondary | ICD-10-CM | POA: Insufficient documentation

## 2021-04-20 DIAGNOSIS — R079 Chest pain, unspecified: Secondary | ICD-10-CM | POA: Diagnosis present

## 2021-04-20 LAB — TROPONIN I (HIGH SENSITIVITY): Troponin I (High Sensitivity): 14 ng/L (ref <18)

## 2022-07-14 IMAGING — DX DG CHEST 1V PORT
1 series · 1 of 1 positions shown · non-contrast
Comparison: 08/22/2020

CLINICAL DATA: Fever.

EXAM:
PORTABLE CHEST 1 VIEW

[chest ap]
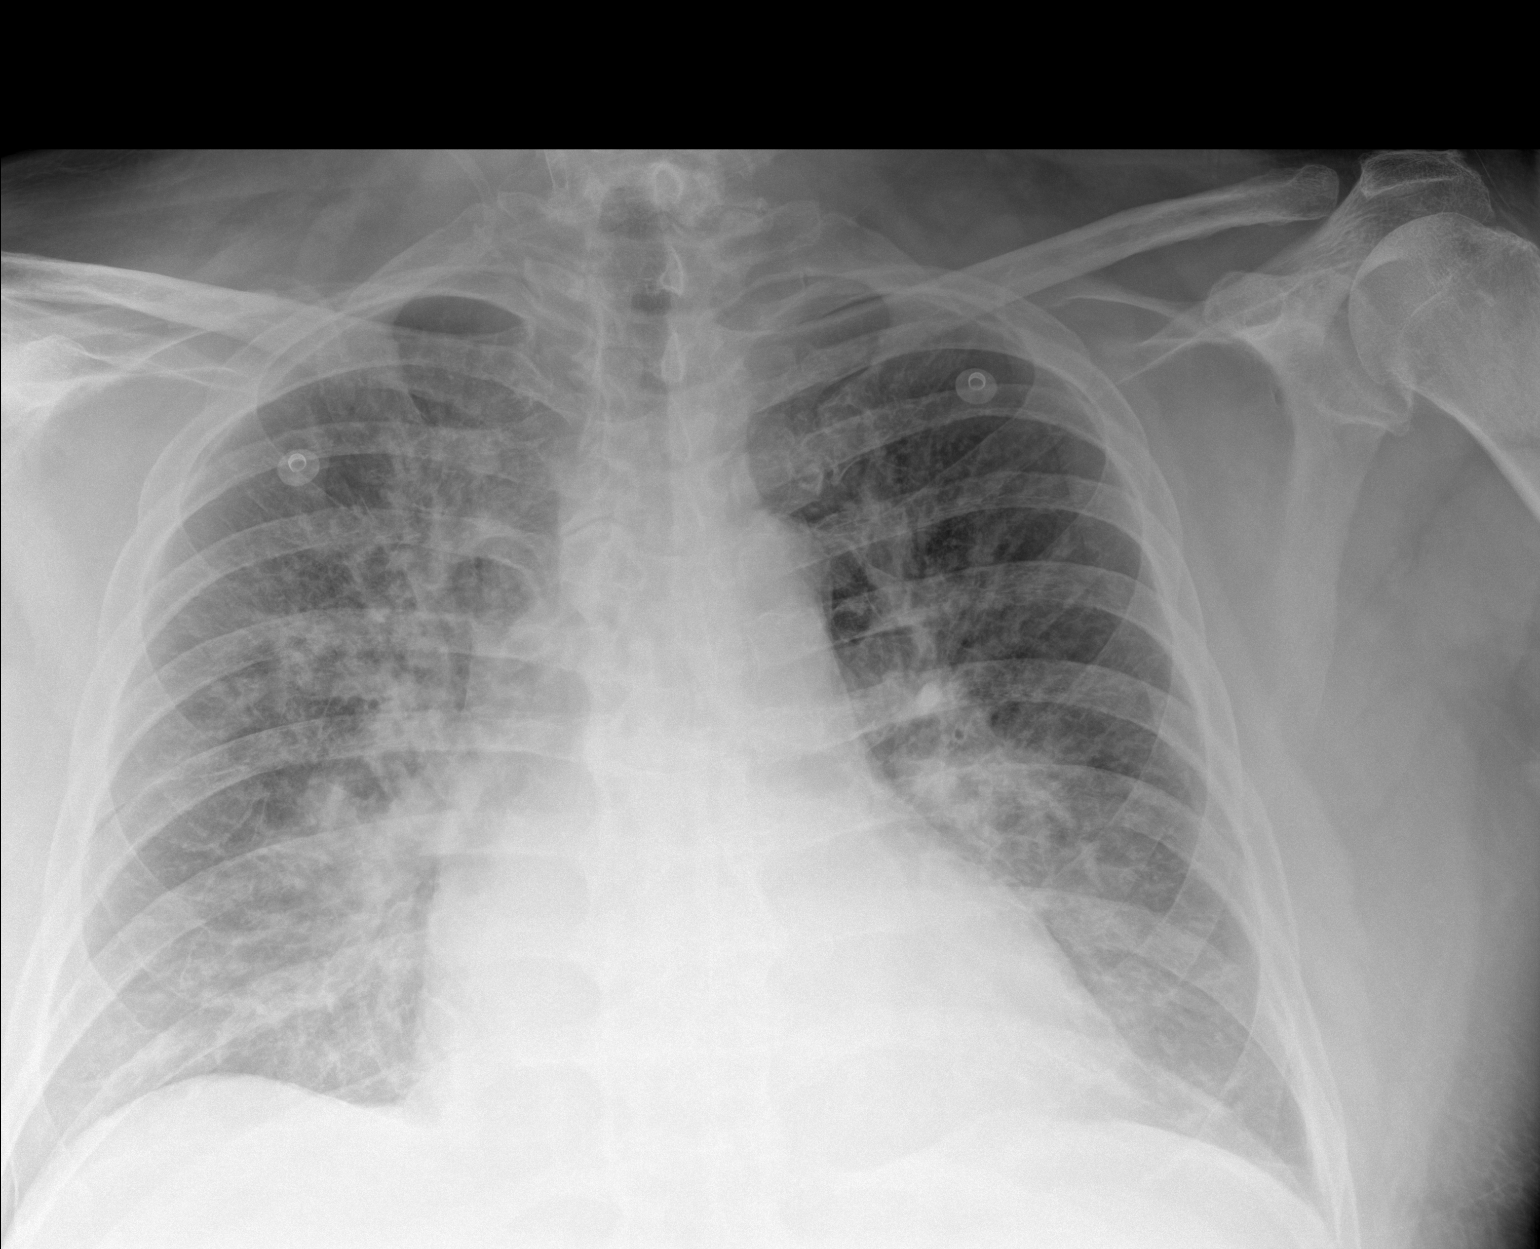

[1 of 1 positions shown; findings below may reference images not displayed]

FINDINGS: New interstitial densities in the central aspect of both lungs,
right side greater than left. Heart size is within normal limits and
stable. The trachea is midline. Bone structures are unremarkable.
IMPRESSION: New enlarged interstitial lung densities bilaterally, right side
greater than left. Differential diagnosis includes atypical
infection versus asymmetric pulmonary edema.

## 2022-12-14 ENCOUNTER — Other Ambulatory Visit: Payer: Self-pay | Admitting: Neurology

## 2022-12-14 DIAGNOSIS — R2689 Other abnormalities of gait and mobility: Secondary | ICD-10-CM

## 2022-12-19 ENCOUNTER — Ambulatory Visit
Admission: RE | Admit: 2022-12-19 | Discharge: 2022-12-19 | Disposition: A | Discharge status: Home / Self Care | Payer: Medicare Other | Source: Ambulatory Visit | Attending: Neurology | Admitting: Neurology

## 2022-12-19 DIAGNOSIS — R2689 Other abnormalities of gait and mobility: Secondary | ICD-10-CM | POA: Insufficient documentation

## 2022-12-25 ENCOUNTER — Inpatient Hospital Stay: Payer: Medicare Other

## 2022-12-25 ENCOUNTER — Encounter: Payer: Self-pay | Admitting: Internal Medicine

## 2022-12-25 ENCOUNTER — Inpatient Hospital Stay: Payer: Medicare Other | Attending: Internal Medicine | Admitting: Internal Medicine

## 2022-12-25 VITALS — BP 133/68 | HR 54 | Temp 98.6°F | Wt 230.0 lb

## 2022-12-25 DIAGNOSIS — Z79899 Other long term (current) drug therapy: Secondary | ICD-10-CM | POA: Insufficient documentation

## 2022-12-25 DIAGNOSIS — D649 Anemia, unspecified: Secondary | ICD-10-CM | POA: Diagnosis not present

## 2022-12-25 DIAGNOSIS — E785 Hyperlipidemia, unspecified: Secondary | ICD-10-CM | POA: Diagnosis not present

## 2022-12-25 DIAGNOSIS — G629 Polyneuropathy, unspecified: Secondary | ICD-10-CM | POA: Insufficient documentation

## 2022-12-25 DIAGNOSIS — G4733 Obstructive sleep apnea (adult) (pediatric): Secondary | ICD-10-CM | POA: Diagnosis not present

## 2022-12-25 DIAGNOSIS — I10 Essential (primary) hypertension: Secondary | ICD-10-CM | POA: Diagnosis not present

## 2022-12-25 DIAGNOSIS — N400 Enlarged prostate without lower urinary tract symptoms: Secondary | ICD-10-CM | POA: Insufficient documentation

## 2022-12-25 LAB — CBC WITH DIFFERENTIAL/PLATELET
Abs Immature Granulocytes: 0.03 10*3/uL (ref 0.00–0.07)
Basophils Absolute: 0.1 10*3/uL (ref 0.0–0.1)
Basophils Relative: 1 %
Eosinophils Absolute: 0.3 10*3/uL (ref 0.0–0.5)
Eosinophils Relative: 5 %
HCT: 38 % — ABNORMAL LOW (ref 39.0–52.0)
Hemoglobin: 13.1 g/dL (ref 13.0–17.0)
Immature Granulocytes: 1 %
Lymphocytes Relative: 22 %
Lymphs Abs: 1.4 10*3/uL (ref 0.7–4.0)
MCH: 33.5 pg (ref 26.0–34.0)
MCHC: 34.5 g/dL (ref 30.0–36.0)
MCV: 97.2 fL (ref 80.0–100.0)
Monocytes Absolute: 0.7 10*3/uL (ref 0.1–1.0)
Monocytes Relative: 10 %
Neutro Abs: 4.1 10*3/uL (ref 1.7–7.7)
Neutrophils Relative %: 61 %
Platelets: 240 10*3/uL (ref 150–400)
RBC: 3.91 MIL/uL — ABNORMAL LOW (ref 4.22–5.81)
RDW: 12.2 % (ref 11.5–15.5)
WBC: 6.7 10*3/uL (ref 4.0–10.5)
nRBC: 0 % (ref 0.0–0.2)

## 2022-12-25 LAB — RETICULOCYTES
Immature Retic Fract: 7.9 % (ref 2.3–15.9)
RBC.: 3.95 MIL/uL — ABNORMAL LOW (ref 4.22–5.81)
Retic Count, Absolute: 58.5 10*3/uL (ref 19.0–186.0)
Retic Ct Pct: 1.5 % (ref 0.4–3.1)

## 2022-12-25 LAB — IRON AND TIBC
Iron: 130 ug/dL (ref 45–182)
Saturation Ratios: 33 % (ref 17.9–39.5)
TIBC: 393 ug/dL (ref 250–450)
UIBC: 263 ug/dL

## 2022-12-25 LAB — LACTATE DEHYDROGENASE: LDH: 143 U/L (ref 98–192)

## 2022-12-25 LAB — FERRITIN: Ferritin: 173 ng/mL (ref 24–336)

## 2022-12-25 NOTE — Progress Notes (Signed)
Patient is doing ok he says besides of his neuropathy that has worsened over the past couple of years

## 2022-12-25 NOTE — Progress Notes (Signed)
[facility]  Telephone:([phone] Fax:(336) [phone]  ID: Nathan Curtis OB: 02-09-1951  MR#: [id number]  QIO#:[id number]  Patient Care Team: Marisue Ivan, MD as PCP - General (Family Medicine) Michaelyn Barter, MD as Consulting Physician (Oncology)  REFERRING PROVIDER: Dr. Sherryll Burger  REASON FOR REFERRAL: Mild anemia and low IgM level  HPI: Nathan Curtis is a 72 y.o. male with past medical history of bilateral lower extremity neuropathy, hypertension, BPH, hyperlipidemia, OSA on CPAP referred to hematology for workup of mild anemia and low IgM level.  Patient follows with Dr. 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  Had further workup for neuropathy.  CBC showed mild anemia with hemoglobin of 12.7, WBC 6.6 and platelet 253.  Creatinine normal, TSH normal, vitamin B12 571, vitamin B1 137, vitamin B6 65, vitamin D34.9, SPEP/IFE IgM 12, M spike not observed, folic acid normal  Patient denies fever, chills, nausea, vomiting, shortness of breath, cough, abdominal pain, bleeding, bowel or bladder issues. Energy level is good.  Appetite is good.  Denies any weight loss. Denies pain.   REVIEW OF SYSTEMS:   ROS  As per HPI. Otherwise, a complete review of systems is negative.  PAST MEDICAL HISTORY: Past Medical History:  Diagnosis Date   Arthritis    lumbar spondyolisthesis    History of kidney stones 1970   passed spontaneously   Hypertension     PAST SURGICAL HISTORY: Past Surgical History:  Procedure Laterality Date   BACK SURGERY     HERNIA REPAIR Right 1969, 2010   inguinal     FAMILY HISTORY: Family History  Problem Relation Age of Onset   Cancer Mother     HEALTH MAINTENANCE: Social History   Tobacco Use   Smoking status: Never   Smokeless tobacco: Never  Substance Use Topics   Alcohol use: No   Drug use: No     No Known  Allergies  Current Outpatient Medications  Medication Sig Dispense Refill   finasteride (PROSCAR) 5 MG tablet Take 1 tablet (5 mg total) by mouth daily. 30 tablet 0   gabapentin (NEURONTIN) 600 MG tablet Take 300 mg by mouth 2 (two) times daily.     levofloxacin (LEVAQUIN) 750 MG tablet Take 1 tablet (750 mg total) by mouth daily. 6 tablet 0   losartan (COZAAR) 100 MG tablet Take 1 tablet by mouth daily.     Multiple Vitamin (MULTIVITAMIN WITH MINERALS) TABS tablet Take 1 tablet by mouth daily.     sertraline (ZOLOFT) 25 MG tablet Take 1 tablet (25 mg total) by mouth daily. 30 tablet 0   simvastatin (ZOCOR) 20 MG tablet Take 20 mg by mouth at bedtime.     tamsulosin (FLOMAX) 0.4 MG CAPS capsule Take 1 capsule (0.4 mg total) by mouth 2 (two) times daily. 60 capsule 0   No current facility-administered medications for this visit.    OBJECTIVE: Vitals:   12/25/22 1133  BP: 133/68  Pulse: (!) 54  Temp: 98.6 F (37 C)  SpO2: 100%     Body mass index is 33 kg/m.      General: Well-developed, well-nourished, no acute distress. Eyes: Pink conjunctiva, anicteric sclera. HEENT: Normocephalic, moist mucous membranes, clear oropharnyx. Lungs: Clear to auscultation bilaterally. Heart: Regular rate and rhythm. No rubs, murmurs, or gallops. Abdomen: Soft, nontender, nondistended. No organomegaly noted, normoactive bowel sounds. Musculoskeletal: No edema, cyanosis, or clubbing. Neuro: Alert, answering all questions appropriately.  Cranial nerves grossly intact. Skin: No rashes or petechiae noted. Psych: Normal affect. Lymphatics: No cervical, calvicular, axillary or inguinal LAD.   LAB RESULTS:  Lab Results  Component Value Date   NA 134 (L) 08/27/2020   K 3.6 08/27/2020   CL 100 08/27/2020   CO2 26 08/27/2020   GLUCOSE 106 (H) 08/27/2020   BUN 18 08/27/2020   CREATININE 0.81 08/27/2020   CALCIUM 8.0 (L) 08/27/2020   PROT 6.0 (L) 08/24/2020   ALBUMIN 3.4 (L) 08/24/2020   AST 25  08/24/2020   ALT 21 08/24/2020   ALKPHOS 45 08/24/2020   BILITOT 1.1 08/24/2020   GFRNONAA >60 08/27/2020   GFRAA >60 02/09/2016    Lab Results  Component Value Date   WBC 8.5 08/27/2020   NEUTROABS 4.5 08/27/2020   HGB 11.8 (L) 08/27/2020   HCT 32.1 (L) 08/27/2020   MCV 91.7 08/27/2020   PLT 214 08/27/2020    No results found for: "TIBC", "FERRITIN", "IRONPCTSAT"   STUDIES: No results found.  ASSESSMENT AND PLAN:   Nathan Curtis is a 72 y.o. male with pmh of bilateral lower extremity neuropathy, hypertension, BPH, hyperlipidemia, OSA on CPAP referred to hematology for workup of mild anemia and low IgM level.  # Severe generalized sensorimotor neuropathy in bilateral lower extremity # Mild anemia - Patient follows with Dr. 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  Had further workup for neuropathy.  - CBC showed mild anemia with hemoglobin of 12.7, WBC 6.6 and platelet 253.  Creatinine normal, TSH normal, vitamin B12 571, vitamin B1 137, vitamin B6 65, vitamin D34.9, SPEP/IFE IgM 12, M spike not observed, folic acid normal  -I discussed with the patient that mildly low IgM level is not clinically significant.  I will check myeloma panel.  Also check iron level, haptoglobin and LDH to complete the workup for anemia.  I will follow-up with him in 2 weeks to discuss labs.   Orders Placed This Encounter  Procedures   CBC with Differential/Platelet   Iron and TIBC   Ferritin   Multiple Myeloma Panel (SPEP&IFE w/QIG)   Kappa/lambda light chains   Reticulocytes   Lactate dehydrogenase   Haptoglobin   Immunofixation, urine   RTC in 2 weeks to discuss labs for MD visit  Patient expressed understanding and was in agreement with this plan. He also understands that He can call clinic at any time with any questions, concerns, or complaints.   I spent a total of 45  minutes reviewing chart data, face-to-face evaluation with the patient, counseling and coordination of care as detailed above.  Michaelyn Barter, MD   12/25/2022 11:39 AM

## 2022-12-26 LAB — HAPTOGLOBIN: Haptoglobin: 118 mg/dL (ref 34–355)

## 2022-12-26 LAB — KAPPA/LAMBDA LIGHT CHAINS
Kappa free light chain: 28.7 mg/L — ABNORMAL HIGH (ref 3.3–19.4)
Kappa, lambda light chain ratio: 1.85 — ABNORMAL HIGH (ref 0.26–1.65)
Lambda free light chains: 15.5 mg/L (ref 5.7–26.3)

## 2022-12-26 LAB — IMMUNOFIXATION, URINE

## 2022-12-28 LAB — MULTIPLE MYELOMA PANEL, SERUM
Albumin SerPl Elph-Mcnc: 4.3 g/dL (ref 2.9–4.4)
Albumin/Glob SerPl: 1.5 (ref 0.7–1.7)
Alpha 1: 0.3 g/dL (ref 0.0–0.4)
Alpha2 Glob SerPl Elph-Mcnc: 0.8 g/dL (ref 0.4–1.0)
B-Globulin SerPl Elph-Mcnc: 1.1 g/dL (ref 0.7–1.3)
Gamma Glob SerPl Elph-Mcnc: 0.7 g/dL (ref 0.4–1.8)
Globulin, Total: 2.9 g/dL (ref 2.2–3.9)
IgA: 110 mg/dL (ref 61–437)
IgG (Immunoglobin G), Serum: 810 mg/dL (ref 603–1613)
IgM (Immunoglobulin M), Srm: 14 mg/dL — ABNORMAL LOW (ref 15–143)
Total Protein ELP: 7.2 g/dL (ref 6.0–8.5)

## 2023-01-08 ENCOUNTER — Ambulatory Visit: Payer: Medicare Other | Admitting: Internal Medicine

## 2023-01-10 ENCOUNTER — Inpatient Hospital Stay: Payer: Medicare Other | Attending: Internal Medicine | Admitting: Internal Medicine

## 2023-01-10 VITALS — BP 128/58 | HR 60 | Temp 97.9°F | Wt 228.0 lb

## 2023-01-10 DIAGNOSIS — D649 Anemia, unspecified: Secondary | ICD-10-CM

## 2023-01-10 DIAGNOSIS — R768 Other specified abnormal immunological findings in serum: Secondary | ICD-10-CM | POA: Diagnosis not present

## 2023-01-10 DIAGNOSIS — G629 Polyneuropathy, unspecified: Secondary | ICD-10-CM

## 2023-01-10 NOTE — Progress Notes (Signed)
[facility]  Telephone:(336) [phone] Fax:(336) [phone]  ID: Denice Paradise OB: 25-Aug-1950  MR#: [id number]  ION#:[id number]  Patient Care Team: Marisue Ivan, MD as PCP - General (Family Medicine) Michaelyn Barter, MD as Consulting Physician (Oncology)  REFERRING PROVIDER: Dr. Sherryll Burger  REASON FOR REFERRAL: Mild anemia and low IgM level  HPI: Nathan Curtis is a 72 y.o. male with past medical history of bilateral lower extremity neuropathy, hypertension, BPH, hyperlipidemia, OSA on CPAP referred to hematology for workup of mild anemia and low IgM level.  Patient follows with Dr. 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  Had further workup for neuropathy.  CBC showed mild anemia with hemoglobin of 12.7, WBC 6.6 and platelet 253.  Creatinine normal, TSH normal, vitamin B12 571, vitamin B1 137, vitamin B6 65, vitamin D34.9, SPEP/IFE IgM 12, M spike not observed, folic acid normal  Patient denies fever, chills, nausea, vomiting, shortness of breath, cough, abdominal pain, bleeding, bowel or bladder issues. Energy level is good.  Appetite is good.  Denies any weight loss. Denies pain.  Interval history Patient was seen today as follow-up to discuss labs. No new concerns.  [facility]  REVIEW OF SYSTEMS:   ROS  As per HPI. Otherwise, a complete review of systems is negative.  PAST MEDICAL HISTORY: Past Medical History:  Diagnosis Date   Arthritis    lumbar spondyolisthesis    History of kidney stones 1970   passed spontaneously   Hypertension     PAST SURGICAL HISTORY: Past Surgical History:  Procedure Laterality Date   BACK SURGERY     HERNIA REPAIR Right 1969, 2010   inguinal     FAMILY HISTORY: Family History  Problem Relation Age of Onset   Cancer Mother     HEALTH MAINTENANCE: Social  History   Tobacco Use   Smoking status: Never   Smokeless tobacco: Never  Substance Use Topics   Alcohol use: No   Drug use: No     No Known Allergies  Current Outpatient Medications  Medication Sig Dispense Refill   finasteride (PROSCAR) 5 MG tablet Take 1 tablet (5 mg total) by mouth daily. 30 tablet 0   gabapentin (NEURONTIN) 600 MG tablet Take 300 mg by mouth 2 (two) times daily.     levofloxacin (LEVAQUIN) 750 MG tablet Take 1 tablet (750 mg total) by mouth daily. 6 tablet 0   losartan (COZAAR) 100 MG tablet Take 1 tablet by mouth daily.     Multiple Vitamin (MULTIVITAMIN WITH MINERALS) TABS tablet Take 1 tablet by mouth daily.     sertraline (ZOLOFT) 25 MG tablet Take 1 tablet (25 mg total) by mouth daily. 30 tablet 0   simvastatin (ZOCOR) 20 MG tablet Take 20 mg by mouth at bedtime.     tamsulosin (FLOMAX) 0.4 MG CAPS capsule Take 1 capsule (0.4 mg total) by mouth 2 (two) times daily. 60 capsule 0   No current facility-administered medications for this visit.    OBJECTIVE: Vitals:   01/10/23 0951  BP: (!) 128/58  Pulse: 60  Temp: 97.9 F (36.6 C)  SpO2: 100%     Body mass index is 32.71 kg/m.      General: Well-developed, well-nourished, no acute distress. Eyes: Pink conjunctiva, anicteric sclera. HEENT: Normocephalic, moist mucous membranes, clear oropharnyx. Lungs: Clear to auscultation bilaterally. Heart: Regular rate and rhythm. No rubs,  murmurs, or gallops. Abdomen: Soft, nontender, nondistended. No organomegaly noted, normoactive bowel sounds. Musculoskeletal: No edema, cyanosis, or clubbing. Neuro: Alert, answering all questions appropriately. Cranial nerves grossly intact. Skin: No rashes or petechiae noted. Psych: Normal affect. Lymphatics: No cervical, calvicular, axillary or inguinal LAD.   LAB RESULTS:  Lab Results  Component Value Date   NA 134 (L) 08/27/2020   K 3.6 08/27/2020   CL 100 08/27/2020   CO2 26 08/27/2020   GLUCOSE 106 (H)  08/27/2020   BUN 18 08/27/2020   CREATININE 0.81 08/27/2020   CALCIUM 8.0 (L) 08/27/2020   PROT 6.0 (L) 08/24/2020   ALBUMIN 3.4 (L) 08/24/2020   AST 25 08/24/2020   ALT 21 08/24/2020   ALKPHOS 45 08/24/2020   BILITOT 1.1 08/24/2020   GFRNONAA >60 08/27/2020   GFRAA >60 02/09/2016    Lab Results  Component Value Date   WBC 6.7 12/25/2022   NEUTROABS 4.1 12/25/2022   HGB 13.1 12/25/2022   HCT 38.0 (L) 12/25/2022   MCV 97.2 12/25/2022   PLT 240 12/25/2022    Lab Results  Component Value Date   TIBC 393 12/25/2022   FERRITIN 173 12/25/2022   IRONPCTSAT 33 12/25/2022     STUDIES: MR LUMBAR SPINE WO CONTRAST  Result Date: 01/06/2023 CLINICAL DATA:  Lumbar fusion 6 years ago. No known injury. Right leg pain and trouble walking. EXAM: MRI LUMBAR SPINE WITHOUT CONTRAST TECHNIQUE: Multiplanar, multisequence MR imaging of the lumbar spine was performed. No intravenous contrast was administered. COMPARISON:  02/08/2021 FINDINGS: Segmentation:  Standard. Alignment: Loss of the normal lumbar lordosis with straightening. No static listhesis. Vertebrae: No acute fracture, evidence of discitis, or aggressive bone lesion. Conus medullaris and cauda equina: Conus extends to the L2 level. Conus and cauda equina appear normal. Paraspinal and other soft tissues: No acute paraspinal abnormality. Partially visualized partial ankylosis of bilateral SI joints. Disc levels: Disc spaces: Degenerative disease with disc height loss at L4-5 and L5-S1. Posterior lumbar fusion from L2 through L4 with susceptibility artifact partially obscuring the adjacent soft tissue and osseous structures. T12-L1: Mild broad-based disc bulge. Mild bilateral facet arthropathy. No foraminal or central canal stenosis. L1-L2: Mild broad-based disc bulge. Moderate bilateral facet arthropathy. No foraminal or central canal stenosis. L2-L3: Interbody fusion and posterior decompression. No foraminal or central canal stenosis. L3-L4:  Interbody fusion and posterior decompression. No foraminal or central canal stenosis. L4-L5: Acquired posterior interbody fusion and posterior osseous ridging. Fusion of the posterior elements bilaterally with hypertrophic changes. Moderate spinal stenosis. Bilateral subarticular recess stenosis. Mild bilateral foraminal stenosis, left worse than right. L5-S1: Broad-based disc osteophyte complex. Severe bilateral facet arthropathy. Bilateral subarticular recess stenosis. Mild spinal stenosis. Moderate-severe bilateral foraminal stenosis. IMPRESSION: 1. At L4-5 there is a acquired posterior interbody fusion and posterior osseous ridging. Fusion of the posterior elements bilaterally with hypertrophic changes. Moderate spinal stenosis. Bilateral subarticular recess stenosis. Mild bilateral foraminal stenosis, left worse than right. 2. At L5-S1 there is a broad-based disc osteophyte complex. Severe bilateral facet arthropathy. Bilateral subarticular recess stenosis. Mild spinal stenosis. Moderate-severe bilateral foraminal stenosis. 3. Posterior lumbar fusion from L2 through L4 with susceptibility artifact partially obscuring the adjacent soft tissue and osseous structures. No foraminal or central canal stenosis. 4. No acute osseous injury of the lumbar spine. Electronically Signed   By: Elige Ko M.D.   On: 01/06/2023 08:48    ASSESSMENT AND PLAN:   Pink Maye is a 72 y.o. male with pmh of bilateral lower extremity neuropathy,  hypertension, BPH, hyperlipidemia, OSA on CPAP referred to hematology for workup of mild anemia and low IgM level.  # Severe generalized sensorimotor neuropathy in bilateral lower extremity # Mild anemia # Low IgM level - Patient follows with Dr. 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  Had further workup for neuropathy.  - CBC showed mild anemia with hemoglobin of 12.7,  WBC 6.6 and platelet 253.  Creatinine normal, TSH normal, vitamin B12 571, vitamin B1 137, vitamin B6 65, vitamin D34.9, SPEP/IFE IgM 12, M spike not observed, folic acid normal  -Repeat labs show hemoglobin of 13.1.  IgM 14, SPEP no M protein.  UPEP/IFE no monoclonal protein.  Kappa 28.7, lambda 15.5 with ratio 1.85.  Mildly low IgM level does not have any clinical significance.  Patient denies any frequent infections.  Kappa light chain and ratio is mildly elevated.  Again with no clinical significance.  His neuropathy is unlikely to be related to myeloma process.  I will repeat his light chains in 6 months to ensure stability.  If stable, he can be discharged and can follow-up with his PCP.   Orders Placed This Encounter  Procedures   CBC with Differential/Platelet   CMP (Cancer Center only)   Multiple Myeloma Panel (SPEP&IFE w/QIG)   Kappa/lambda light chains   RTC in 6 months for MD visit, labs 10 days prior.  Patient expressed understanding and was in agreement with this plan. He also understands that He can call clinic at any time with any questions, concerns, or complaints.   I spent a total of 25 minutes reviewing chart data, face-to-face evaluation with the patient, counseling and coordination of care as detailed above.  Michaelyn Barter, MD   01/10/2023 1:11 PM

## 2023-01-10 NOTE — Progress Notes (Signed)
Patient had an MRI on 12/19/2022

## 2023-07-01 ENCOUNTER — Other Ambulatory Visit: Payer: Medicare Other

## 2023-07-11 ENCOUNTER — Ambulatory Visit: Payer: Medicare Other | Admitting: Internal Medicine

## 2023-07-18 ENCOUNTER — Inpatient Hospital Stay: Payer: Medicare Other | Attending: Internal Medicine

## 2023-07-18 DIAGNOSIS — D649 Anemia, unspecified: Secondary | ICD-10-CM | POA: Diagnosis present

## 2023-07-18 DIAGNOSIS — D804 Selective deficiency of immunoglobulin M [IgM]: Secondary | ICD-10-CM | POA: Insufficient documentation

## 2023-07-18 DIAGNOSIS — G629 Polyneuropathy, unspecified: Secondary | ICD-10-CM | POA: Insufficient documentation

## 2023-07-18 LAB — CMP (CANCER CENTER ONLY)
ALT: 23 U/L (ref 0–44)
AST: 25 U/L (ref 15–41)
Albumin: 4.4 g/dL (ref 3.5–5.0)
Alkaline Phosphatase: 50 U/L (ref 38–126)
Anion gap: 8 (ref 5–15)
BUN: 18 mg/dL (ref 8–23)
CO2: 25 mmol/L (ref 22–32)
Calcium: 9 mg/dL (ref 8.9–10.3)
Chloride: 104 mmol/L (ref 98–111)
Creatinine: 1.04 mg/dL (ref 0.61–1.24)
GFR, Estimated: >60 mL/min (ref >60)
Glucose, Bld: 101 mg/dL — ABNORMAL HIGH (ref 70–99)
Potassium: 4.3 mmol/L (ref 3.5–5.1)
Sodium: 137 mmol/L (ref 135–145)
Total Bilirubin: 0.9 mg/dL (ref 0.0–1.2)
Total Protein: 7.2 g/dL (ref 6.5–8.1)

## 2023-07-18 LAB — CBC WITH DIFFERENTIAL/PLATELET
Abs Immature Granulocytes: 0.02 10*3/uL (ref 0.00–0.07)
Basophils Absolute: 0.1 10*3/uL (ref 0.0–0.1)
Basophils Relative: 1 %
Eosinophils Absolute: 0.3 10*3/uL (ref 0.0–0.5)
Eosinophils Relative: 5 %
HCT: 41.9 % (ref 39.0–52.0)
Hemoglobin: 14.1 g/dL (ref 13.0–17.0)
Immature Granulocytes: 0 %
Lymphocytes Relative: 21 %
Lymphs Abs: 1.6 10*3/uL (ref 0.7–4.0)
MCH: 32.3 pg (ref 26.0–34.0)
MCHC: 33.7 g/dL (ref 30.0–36.0)
MCV: 95.9 fL (ref 80.0–100.0)
Monocytes Absolute: 0.9 10*3/uL (ref 0.1–1.0)
Monocytes Relative: 11 %
Neutro Abs: 4.6 10*3/uL (ref 1.7–7.7)
Neutrophils Relative %: 62 %
Platelets: 229 10*3/uL (ref 150–400)
RBC: 4.37 MIL/uL (ref 4.22–5.81)
RDW: 12.2 % (ref 11.5–15.5)
WBC: 7.5 10*3/uL (ref 4.0–10.5)
nRBC: 0 % (ref 0.0–0.2)

## 2023-07-22 LAB — MULTIPLE MYELOMA PANEL, SERUM
Albumin SerPl Elph-Mcnc: 4.2 g/dL (ref 2.9–4.4)
Albumin/Glob SerPl: 1.8 — ABNORMAL HIGH (ref 0.7–1.7)
Alpha 1: 0.2 g/dL (ref 0.0–0.4)
Alpha2 Glob SerPl Elph-Mcnc: 0.7 g/dL (ref 0.4–1.0)
B-Globulin SerPl Elph-Mcnc: 1 g/dL (ref 0.7–1.3)
Gamma Glob SerPl Elph-Mcnc: 0.6 g/dL (ref 0.4–1.8)
Globulin, Total: 2.4 g/dL (ref 2.2–3.9)
IgA: 96 mg/dL (ref 61–437)
IgG (Immunoglobin G), Serum: 755 mg/dL (ref 603–1613)
IgM (Immunoglobulin M), Srm: 13 mg/dL — ABNORMAL LOW (ref 15–143)
Total Protein ELP: 6.6 g/dL (ref 6.0–8.5)

## 2023-07-22 LAB — KAPPA/LAMBDA LIGHT CHAINS
Kappa free light chain: 14.2 mg/L (ref 3.3–19.4)
Kappa, lambda light chain ratio: 1.54 (ref 0.26–1.65)
Lambda free light chains: 9.2 mg/L (ref 5.7–26.3)

## 2023-07-29 ENCOUNTER — Encounter: Payer: Self-pay | Admitting: Internal Medicine

## 2023-07-29 ENCOUNTER — Inpatient Hospital Stay: Payer: Medicare Other | Admitting: Internal Medicine

## 2023-07-29 VITALS — BP 143/62 | HR 56 | Temp 97.7°F | Resp 18 | Wt 235.0 lb

## 2023-07-29 DIAGNOSIS — R768 Other specified abnormal immunological findings in serum: Secondary | ICD-10-CM

## 2023-07-29 DIAGNOSIS — D649 Anemia, unspecified: Secondary | ICD-10-CM | POA: Diagnosis not present

## 2023-07-29 NOTE — Progress Notes (Signed)
 Nathan Curtis  [phone] Fax:([phone]) [phone]  ID: Nathan Curtis OB: Sep 03, 1950  MR#: [id number]  CSN#:[id number]  Patient Care Team: Monique Ano, MD as PCP - General (Family Medicine) Dalyla Chui, MD as Consulting Physician (Oncology)  REFERRING PROVIDER: Dr. Mason Sole  REASON FOR REFERRAL: Mild anemia and low IgM level  HPI: Nathan Curtis is a 73 y.o. male with past medical history of bilateral lower extremity neuropathy, hypertension, BPH, hyperlipidemia, OSA on CPAP referred to hematology for workup of mild anemia and low IgM level.  Patient follows with Dr. Mason Sole of neurology for severe generalized sensorimotor peripheral neuropathy with right L5 and L4 radiculopathy with history of lumbar spinal fusion.  Had further workup for neuropathy.  CBC showed mild anemia with hemoglobin of 12.7, WBC 6.6 and platelet 253.  Creatinine normal, TSH normal, vitamin B12 571, vitamin B1 137, vitamin B6 65, vitamin D34.9, SPEP/IFE IgM 12, M spike not observed, folic acid normal  Patient denies fever, chills, nausea, vomiting, shortness of breath, cough, abdominal pain, bleeding, bowel or bladder issues. Energy level is good.  Appetite is good.  Denies any weight loss. Denies pain.  Interval history Patient was seen today as follow-up to discuss labs. He continues to have neuropathy and has been seeing a specialist trying different techniques which has been helping.  Denies any frequent infections.  Does report pain and some swelling in the left pectoral muscle for about couple of months.  Thinks he may be sleeping on the tubing of the CPAP machine.  He has made the changes with that but still the pain has not improved.  REVIEW OF SYSTEMS:   ROS  As per HPI. Otherwise, a complete review of systems is negative.  PAST MEDICAL HISTORY: Past Medical History:  Diagnosis Date   Arthritis    lumbar spondyolisthesis    History of kidney stones 1970   passed  spontaneously   Hypertension     PAST SURGICAL HISTORY: Past Surgical History:  Procedure Laterality Date   BACK SURGERY     HERNIA REPAIR Right 1969, 2010   inguinal     FAMILY HISTORY: Family History  Problem Relation Age of Onset   Cancer Mother     HEALTH MAINTENANCE: Social History   Tobacco Use   Smoking status: Never   Smokeless tobacco: Never  Substance Use Topics   Alcohol use: No   Drug use: No     No Known Allergies  Current Outpatient Medications  Medication Sig Dispense Refill   finasteride  (PROSCAR ) 5 MG tablet Take 1 tablet (5 mg total) by mouth daily. 30 tablet 0   gabapentin  (NEURONTIN ) 600 MG tablet Take 300 mg by mouth 2 (two) times daily.     levofloxacin  (LEVAQUIN ) 750 MG tablet Take 1 tablet (750 mg total) by mouth daily. 6 tablet 0   losartan  (COZAAR ) 100 MG tablet Take 1 tablet by mouth daily.     Multiple Vitamin (MULTIVITAMIN WITH MINERALS) TABS tablet Take 1 tablet by mouth daily.     sertraline  (ZOLOFT ) 25 MG tablet Take 1 tablet (25 mg total) by mouth daily. 30 tablet 0   simvastatin  (ZOCOR ) 20 MG tablet Take 20 mg by mouth at bedtime.     tamsulosin  (FLOMAX ) 0.4 MG CAPS capsule Take 1 capsule (0.4 mg total) by mouth 2 (two) times daily. 60 capsule 0   No current facility-administered medications for this visit.    OBJECTIVE: Vitals:   07/29/23 1024  BP: (!) 143/62  Pulse: Aaron Aas)  56  Resp: 18  Temp: 97.7 F (36.5 C)  SpO2: 98%     Body mass index is 33.72 kg/m.      General: Well-developed, well-nourished, no acute distress. Eyes: Pink conjunctiva, anicteric sclera. HEENT: Normocephalic, moist mucous membranes, clear oropharnyx. Lungs: Clear to auscultation bilaterally. Heart: Regular rate and rhythm. No rubs, murmurs, or gallops. Abdomen: Soft, nontender, nondistended. No organomegaly noted, normoactive bowel sounds. Musculoskeletal: No edema, cyanosis, or clubbing. Neuro: Alert, answering all questions appropriately. Cranial  nerves grossly intact. Skin: No rashes or petechiae noted. Psych: Normal affect. Lymphatics: No cervical, calvicular, axillary or inguinal LAD.   LAB RESULTS:  Lab Results  Component Value Date   NA 137 07/18/2023   K 4.3 07/18/2023   CL 104 07/18/2023   CO2 25 07/18/2023   GLUCOSE 101 (H) 07/18/2023   BUN 18 07/18/2023   CREATININE 1.04 07/18/2023   CALCIUM 9.0 07/18/2023   PROT 7.2 07/18/2023   ALBUMIN  4.4 07/18/2023   AST 25 07/18/2023   ALT 23 07/18/2023   ALKPHOS 50 07/18/2023   BILITOT 0.9 07/18/2023   GFRNONAA >60 07/18/2023   GFRAA >60 02/09/2016    Lab Results  Component Value Date   WBC 7.5 07/18/2023   NEUTROABS 4.6 07/18/2023   HGB 14.1 07/18/2023   HCT 41.9 07/18/2023   MCV 95.9 07/18/2023   PLT 229 07/18/2023    Lab Results  Component Value Date   TIBC 393 12/25/2022   FERRITIN 173 12/25/2022   IRONPCTSAT 33 12/25/2022     STUDIES: No results found.  ASSESSMENT AND PLAN:   [name] is a 73 y.o. male with pmh of bilateral lower extremity neuropathy, hypertension, BPH, hyperlipidemia, OSA on CPAP referred to hematology for workup of mild anemia and low IgM level.  # Severe generalized sensorimotor neuropathy in bilateral lower extremity # Low IgM level - Patient follows with Dr. Mason Sole of neurology for severe generalized sensorimotor peripheral neuropathy with right L5 and L4 radiculopathy with history of lumbar spinal fusion.  Had further workup for neuropathy.  - Repeat labs discussed with the patient.  IgM mildly low at 13.  He denies any frequent infections and does not hold clinical significance.  His SPEP/IFE and kappa lambda protein level has been normal.  No monoclonal protein detected.  CBC and CMP is unremarkable.  His neuropathy is unrelated to hematologic condition.  Patient will continue to follow with PCP.  Follow-up with hematology as needed.  Complains of pain in the left pectoral muscle for couple of months.  On exam, no  discrete masses.  If pain persist, patient advised to discuss with PCP for consideration of further imaging.  No orders of the defined types were placed in this encounter. RTC as needed.  Patient expressed understanding and was in agreement with this plan. He also understands that He can call clinic at any time with any questions, concerns, or complaints.   I spent a total of 25 minutes reviewing chart data, face-to-face evaluation with the patient, counseling and coordination of care as detailed above.  Loreatha Rodney, MD   07/29/2023 12:16 PM

## 2023-07-29 NOTE — Progress Notes (Signed)
 Patient noticed a place on in his left pectoral muscle,it is swollen, and it hurts only when you touch. He has been seeing a provider for his neuropathy and the exercise that he encourages him to do is helping.

## 2024-03-11 ENCOUNTER — Observation Stay
Admission: RE | Admit: 2024-03-11 | Discharge: 2024-03-12 | Disposition: A | Attending: Cardiology | Admitting: Internal Medicine

## 2024-03-11 ENCOUNTER — Encounter: Payer: Self-pay | Admitting: Internal Medicine

## 2024-03-11 ENCOUNTER — Other Ambulatory Visit: Payer: Self-pay

## 2024-03-11 ENCOUNTER — Encounter: Admission: RE | Disposition: A | Payer: Self-pay | Attending: Cardiology

## 2024-03-11 DIAGNOSIS — I25110 Atherosclerotic heart disease of native coronary artery with unstable angina pectoris: Principal | ICD-10-CM | POA: Insufficient documentation

## 2024-03-11 DIAGNOSIS — I200 Unstable angina: Secondary | ICD-10-CM

## 2024-03-11 DIAGNOSIS — Z23 Encounter for immunization: Secondary | ICD-10-CM | POA: Insufficient documentation

## 2024-03-11 DIAGNOSIS — I5022 Chronic systolic (congestive) heart failure: Secondary | ICD-10-CM | POA: Insufficient documentation

## 2024-03-11 DIAGNOSIS — Z955 Presence of coronary angioplasty implant and graft: Secondary | ICD-10-CM

## 2024-03-11 DIAGNOSIS — Z79899 Other long term (current) drug therapy: Secondary | ICD-10-CM | POA: Insufficient documentation

## 2024-03-11 DIAGNOSIS — I110 Hypertensive heart disease with heart failure: Secondary | ICD-10-CM | POA: Insufficient documentation

## 2024-03-11 HISTORY — PX: CORONARY STENT INTERVENTION: CATH118234

## 2024-03-11 HISTORY — PX: LEFT HEART CATH AND CORONARY ANGIOGRAPHY: CATH118249

## 2024-03-11 LAB — POCT ACTIVATED CLOTTING TIME: Activated Clotting Time: 281 s

## 2024-03-11 SURGERY — LEFT HEART CATH AND CORONARY ANGIOGRAPHY
Anesthesia: Moderate Sedation

## 2024-03-11 MED ORDER — ACETAMINOPHEN 325 MG PO TABS: 650.0000 mg | ORAL_TABLET | ORAL | Status: DC | PRN

## 2024-03-11 MED ORDER — ROSUVASTATIN CALCIUM 10 MG PO TABS
40.0000 mg | ORAL_TABLET | Freq: Every day | ORAL | Status: DC
  Administered 2024-03-11 – 2024-03-12 (×2): 40 mg via ORAL
  Filled 2024-03-11: qty 2
  Filled 2024-03-11: qty 4

## 2024-03-11 MED ORDER — ONDANSETRON HCL 4 MG/2ML IJ SOLN
4.0000 mg | Freq: Four times a day (QID) | INTRAMUSCULAR | Status: DC | PRN
  Filled 2024-03-11: qty 2

## 2024-03-11 MED ORDER — SODIUM CHLORIDE 0.9% FLUSH: 3.0000 mL | Freq: Two times a day (BID) | INTRAVENOUS | Status: DC

## 2024-03-11 MED ORDER — ASPIRIN 81 MG PO TBEC
81.0000 mg | DELAYED_RELEASE_TABLET | Freq: Every day | ORAL | Status: DC
  Administered 2024-03-12: 81 mg via ORAL
  Filled 2024-03-11: qty 1

## 2024-03-11 MED ORDER — VERAPAMIL HCL 2.5 MG/ML IV SOLN
INTRAVENOUS | Status: AC
  Filled 2024-03-11: qty 2

## 2024-03-11 MED ORDER — MIDAZOLAM HCL 2 MG/2ML IJ SOLN
INTRAMUSCULAR | Status: AC
  Filled 2024-03-11: qty 2

## 2024-03-11 MED ORDER — ISOSORBIDE MONONITRATE ER 30 MG PO TB24
30.0000 mg | ORAL_TABLET | Freq: Every day | ORAL | Status: DC
  Administered 2024-03-12: 30 mg via ORAL
  Filled 2024-03-11 (×2): qty 1

## 2024-03-11 MED ORDER — SODIUM CHLORIDE 0.9% FLUSH: 3.0000 mL | INTRAVENOUS | Status: DC | PRN

## 2024-03-11 MED ORDER — TICAGRELOR 90 MG PO TABS
90.0000 mg | ORAL_TABLET | Freq: Two times a day (BID) | ORAL | Status: DC
  Administered 2024-03-12: 90 mg via ORAL
  Filled 2024-03-11: qty 1

## 2024-03-11 MED ORDER — LIDOCAINE HCL (PF) 1 % IJ SOLN
INTRAMUSCULAR | Status: DC | PRN
  Administered 2024-03-11: 5 mL

## 2024-03-11 MED ORDER — AMLODIPINE BESYLATE 10 MG PO TABS
10.0000 mg | ORAL_TABLET | Freq: Every day | ORAL | Status: DC
  Administered 2024-03-11 – 2024-03-12 (×2): 10 mg via ORAL
  Filled 2024-03-11: qty 1
  Filled 2024-03-11: qty 2

## 2024-03-11 MED ORDER — HEPARIN (PORCINE) IN NACL 1000-0.9 UT/500ML-% IV SOLN
INTRAVENOUS | Status: DC | PRN
  Administered 2024-03-11: 1000 mL

## 2024-03-11 MED ORDER — GABAPENTIN 300 MG PO CAPS
600.0000 mg | ORAL_CAPSULE | Freq: Every day | ORAL | Status: DC
  Administered 2024-03-11: 600 mg via ORAL
  Filled 2024-03-11 (×2): qty 2

## 2024-03-11 MED ORDER — FENTANYL CITRATE (PF) 100 MCG/2ML IJ SOLN
INTRAMUSCULAR | Status: AC
  Filled 2024-03-11: qty 2

## 2024-03-11 MED ORDER — HEPARIN SODIUM (PORCINE) 1000 UNIT/ML IJ SOLN
INTRAMUSCULAR | Status: AC
  Filled 2024-03-11: qty 10

## 2024-03-11 MED ORDER — TICAGRELOR 90 MG PO TABS
ORAL_TABLET | ORAL | Status: DC | PRN
  Administered 2024-03-11: 180 mg via ORAL

## 2024-03-11 MED ORDER — IOHEXOL 300 MG/ML  SOLN
INTRAMUSCULAR | Status: DC | PRN
  Administered 2024-03-11: 117 mL

## 2024-03-11 MED ORDER — PNEUMOCOCCAL 20-VAL CONJ VACC 0.5 ML IM SUSY
0.5000 mL | PREFILLED_SYRINGE | INTRAMUSCULAR | Status: AC
  Administered 2024-03-12: 0.5 mL via INTRAMUSCULAR
  Filled 2024-03-11: qty 0.5

## 2024-03-11 MED ORDER — TICAGRELOR 90 MG PO TABS
ORAL_TABLET | ORAL | Status: AC
  Filled 2024-03-11: qty 2

## 2024-03-11 MED ORDER — FREE WATER
250.0000 mL | Freq: Once | Status: AC
  Administered 2024-03-11: 250 mL via ORAL

## 2024-03-11 MED ORDER — TAMSULOSIN HCL 0.4 MG PO CAPS
0.4000 mg | ORAL_CAPSULE | Freq: Two times a day (BID) | ORAL | Status: DC
  Administered 2024-03-12: 0.4 mg via ORAL
  Filled 2024-03-11 (×3): qty 1

## 2024-03-11 MED ORDER — SODIUM CHLORIDE 0.9% FLUSH
3.0000 mL | Freq: Two times a day (BID) | INTRAVENOUS | Status: DC
  Administered 2024-03-11 – 2024-03-12 (×2): 3 mL via INTRAVENOUS

## 2024-03-11 MED ORDER — VERAPAMIL HCL 2.5 MG/ML IV SOLN
INTRAVENOUS | Status: DC | PRN
  Administered 2024-03-11: 2.5 mg via INTRACORONARY

## 2024-03-11 MED ORDER — METOPROLOL SUCCINATE ER 25 MG PO TB24
12.5000 mg | ORAL_TABLET | Freq: Every day | ORAL | Status: DC
  Administered 2024-03-11 – 2024-03-12 (×2): 12.5 mg via ORAL
  Filled 2024-03-11: qty 0.5
  Filled 2024-03-11: qty 1

## 2024-03-11 MED ORDER — IRBESARTAN 150 MG PO TABS
300.0000 mg | ORAL_TABLET | Freq: Every day | ORAL | Status: DC
  Administered 2024-03-11: 300 mg via ORAL
  Filled 2024-03-11 (×2): qty 2

## 2024-03-11 MED ORDER — MIDAZOLAM HCL (PF) 2 MG/2ML IJ SOLN
INTRAMUSCULAR | Status: DC | PRN
  Administered 2024-03-11: 1 mg via INTRAVENOUS

## 2024-03-11 MED ORDER — FENTANYL CITRATE (PF) 100 MCG/2ML IJ SOLN
INTRAMUSCULAR | Status: DC | PRN
  Administered 2024-03-11: 25 ug via INTRAVENOUS

## 2024-03-11 MED ORDER — GABAPENTIN 300 MG PO CAPS
300.0000 mg | ORAL_CAPSULE | Freq: Every day | ORAL | Status: DC
  Administered 2024-03-12: 300 mg via ORAL
  Filled 2024-03-11 (×2): qty 1

## 2024-03-11 MED ORDER — ASPIRIN 81 MG PO CHEW
CHEWABLE_TABLET | ORAL | Status: DC | PRN
  Administered 2024-03-11: 243 mg via ORAL

## 2024-03-11 MED ORDER — SODIUM CHLORIDE 0.9 % IV SOLN: 250.0000 mL | INTRAVENOUS | Status: DC | PRN

## 2024-03-11 MED ORDER — HEPARIN SODIUM (PORCINE) 1000 UNIT/ML IJ SOLN
INTRAMUSCULAR | Status: DC | PRN
  Administered 2024-03-11: 8000 [IU] via INTRAVENOUS
  Administered 2024-03-11: 4000 [IU] via INTRAVENOUS
  Administered 2024-03-11: 3000 [IU] via INTRAVENOUS

## 2024-03-11 MED ORDER — ASPIRIN 81 MG PO CHEW: 81.0000 mg | CHEWABLE_TABLET | ORAL | Status: DC

## 2024-03-11 MED ORDER — ASPIRIN 81 MG PO CHEW
CHEWABLE_TABLET | ORAL | Status: AC
  Filled 2024-03-11: qty 3

## 2024-03-11 MED ORDER — SPIRONOLACTONE 25 MG PO TABS
25.0000 mg | ORAL_TABLET | Freq: Every day | ORAL | Status: DC
  Administered 2024-03-11 – 2024-03-12 (×2): 25 mg via ORAL
  Filled 2024-03-11 (×2): qty 1

## 2024-03-11 MED ORDER — HYDRALAZINE HCL 20 MG/ML IJ SOLN: 10.0000 mg | INTRAMUSCULAR | Status: AC | PRN

## 2024-03-11 MED ORDER — LIDOCAINE HCL 1 % IJ SOLN
INTRAMUSCULAR | Status: AC
  Filled 2024-03-11: qty 20

## 2024-03-11 MED ORDER — HEPARIN (PORCINE) IN NACL 1000-0.9 UT/500ML-% IV SOLN
INTRAVENOUS | Status: AC
  Filled 2024-03-11: qty 1000

## 2024-03-11 MED ORDER — INFLUENZA VAC SPLIT HIGH-DOSE 0.5 ML IM SUSY
0.5000 mL | PREFILLED_SYRINGE | INTRAMUSCULAR | Status: AC
  Administered 2024-03-12: 0.5 mL via INTRAMUSCULAR
  Filled 2024-03-11: qty 0.5

## 2024-03-11 MED ORDER — DAPAGLIFLOZIN PROPANEDIOL 10 MG PO TABS
10.0000 mg | ORAL_TABLET | Freq: Every day | ORAL | Status: DC
  Administered 2024-03-11: 10 mg via ORAL
  Filled 2024-03-11: qty 1

## 2024-03-11 MED ORDER — FREE WATER: 250.0000 mL | Freq: Once | Status: DC

## 2024-03-11 SURGICAL SUPPLY — 13 items
CATH 5FR JL3.5 JR4 ANG PIG MP (CATHETERS) IMPLANT
CATH VISTA GUIDE 6FR XB3.5 EPK (CATHETERS) IMPLANT
DEVICE RAD TR BAND REGULAR (VASCULAR PRODUCTS) IMPLANT
DRAPE BRACHIAL (DRAPES) IMPLANT
GLIDESHEATH SLEND SS 6F .021 (SHEATH) IMPLANT
GUIDEWIRE INQWIRE 1.5J.035X260 (WIRE) IMPLANT
KIT ENCORE 26 ADVANTAGE (KITS) IMPLANT
PACK CARDIAC CATH (CUSTOM PROCEDURE TRAY) ×2 IMPLANT
SET ATX-X65L (MISCELLANEOUS) IMPLANT
STATION PROTECTION PRESSURIZED (MISCELLANEOUS) IMPLANT
STENT ONYX FRONTIER 2.25X15 (Permanent Stent) IMPLANT
TUBING CIL FLEX 10 FLL-RA (TUBING) IMPLANT
WIRE G HI TQ BMW 190 (WIRE) IMPLANT

## 2024-03-11 NOTE — Progress Notes (Signed)
 Brief cardiology note not for biling   Patient presented today for outpatient LHC. Found to have severe stenosis of distal LCx and underwent stent placement without complications. Will be admitted for observation overnight and if no issues then will plan for discharge tomorrow morning.    SignedBETHA Danita Bloch, PA-C  03/11/24 2:08 PM

## 2024-03-12 ENCOUNTER — Other Ambulatory Visit: Payer: Self-pay

## 2024-03-12 LAB — CBC
HCT: 40.8 % (ref 39.0–52.0)
Hemoglobin: 14.1 g/dL (ref 13.0–17.0)
MCH: 32.9 pg (ref 26.0–34.0)
MCHC: 34.6 g/dL (ref 30.0–36.0)
MCV: 95.3 fL (ref 80.0–100.0)
Platelets: 221 K/uL (ref 150–400)
RBC: 4.28 MIL/uL (ref 4.22–5.81)
RDW: 12.3 % (ref 11.5–15.5)
WBC: 8.3 K/uL (ref 4.0–10.5)
nRBC: 0 % (ref 0.0–0.2)

## 2024-03-12 LAB — BASIC METABOLIC PANEL WITH GFR
Anion gap: 13 (ref 5–15)
BUN: 18 mg/dL (ref 8–23)
CO2: 24 mmol/L (ref 22–32)
Calcium: 9.1 mg/dL (ref 8.9–10.3)
Chloride: 103 mmol/L (ref 98–111)
Creatinine, Ser: 0.93 mg/dL (ref 0.61–1.24)
GFR, Estimated: >60 mL/min (ref >60)
Glucose, Bld: 92 mg/dL (ref 70–99)
Potassium: 4 mmol/L (ref 3.5–5.1)
Sodium: 139 mmol/L (ref 135–145)

## 2024-03-12 LAB — CARDIAC CATHETERIZATION: Cath EF Quantitative: 55 %

## 2024-03-12 MED ORDER — ROSUVASTATIN CALCIUM 40 MG PO TABS
40.0000 mg | ORAL_TABLET | Freq: Every day | ORAL | 0 refills | Status: AC
  Filled 2024-03-12: qty 30, 30d supply, fill #0

## 2024-03-12 MED ORDER — TICAGRELOR 90 MG PO TABS
90.0000 mg | ORAL_TABLET | Freq: Two times a day (BID) | ORAL | 0 refills | Status: AC
  Filled 2024-03-12: qty 60, 30d supply, fill #0

## 2024-03-12 MED ORDER — METOPROLOL SUCCINATE ER 25 MG PO TB24
12.5000 mg | ORAL_TABLET | Freq: Every day | ORAL | 0 refills | Status: AC
  Filled 2024-03-12: qty 30, 60d supply, fill #0

## 2024-03-12 MED ORDER — ALUM & MAG HYDROXIDE-SIMETH 200-200-20 MG/5ML PO SUSP
30.0000 mL | ORAL | Status: DC | PRN
  Administered 2024-03-12: 30 mL via ORAL
  Filled 2024-03-12: qty 30

## 2024-03-12 NOTE — Plan of Care (Signed)

## 2024-03-12 NOTE — Discharge Summary (Signed)
 Discharge Summary      Patient ID: Nathan Curtis MRN: [id number] DOB/AGE: [birth year] 73 y.o.  Admit date: 03/11/2024 Discharge date: 03/12/2024  Primary Discharge Diagnosis Coronary artery disease with unstable angina Secondary Discharge Diagnosis: S/p percutaneous coronary intervention  Significant Diagnostic Studies: Left heart catheterization with percutaneous coronary intervention   Consults: None  Hospital Course: The patient was brought to the cardiac cath lab and underwent left heart catheterization and coronary angiography with Dr. Florencio on 03/11/24. The patient tolerated with procedure well without complications.  The patient received ONYX FRONTIER 2.25X15 stent to the distal LCx . On 03/12/2024 the R wrist access site was examined and found to be without significant erythema, tenderness to palpation, or apparent aneurysm. Hospital course was overall uneventful, on day of discharge the patient was ambulatory and eager to go home.  Discussed cardiac rehab and new prescription medications in detail. The patient was given aftercare instructions and ER return precautions. Will arrange for follow up in office in 1 week, or sooner if needed. Appointment date/time listed below and documented in Provider Navigator tab.  Discharge Exam: Blood pressure (!) 130/55, pulse (!) 53, temperature 98.7 F (37.1 C), temperature source Oral, resp. rate 20, height 5' 10 (1.778 m), weight 102.5 kg, SpO2 91%.   General: Well appearing elderly male, well nourished, in no acute distress.  HEENT: Normocephalic and atraumatic. Neck: No JVD.  Lungs: Normal respiratory effort on room air.  Clear to ascultation bilaterally. Heart: HRRR . Normal S1 and S2 without gallops or murmurs.  Abdomen: Non-distended appearing.  Msk: Normal strength and tone for age. Extremities: No peripheral edema. R radial access site without significant tenderness to palpation, apparent aneurysm, or significant  ecchymosis. Covered with clean gauze and tegaderm dressing.  Neuro: Alert and oriented x3 Psych: Calm and cooperative.   Labs: Lab Results  Component Value Date   WBC 8.3 03/12/2024   HGB 14.1 03/12/2024   HCT 40.8 03/12/2024   MCV 95.3 03/12/2024   PLT 221 03/12/2024    Recent Labs  Lab 03/12/24 0516  NA 139  K 4.0  CL 103  CO2 24  BUN 18  CREATININE 0.93  CALCIUM  9.1  GLUCOSE 92      Radiology: None EKG: sinus bradycardia rate 54 bpm  FOLLOW UP PLANS AND APPOINTMENTS Discharge Instructions     AMB Referral to Cardiac Rehabilitation - Phase II   Complete by: As directed    Diagnosis: Coronary Stents   After initial evaluation and assessments completed: Virtual Based Care may be provided alone or in conjunction with Phase 2 Cardiac Rehab based on patient barriers.: Yes   Intensive Cardiac Rehabilitation (ICR) [facility] location only OR Traditional Cardiac Rehabilitation (TCR) *If criteria for ICR are not met will enroll in TCR ([facility] only): Yes      Allergies as of 03/12/2024   No Known Allergies      Medication List     TAKE these medications    acetaminophen  650 MG CR tablet Commonly known as: TYLENOL  Take 650 mg by mouth in the morning and at bedtime.   Alpha-Lipoic Acid 600 MG Caps Take 1 capsule by mouth daily.   amLODipine  10 MG tablet Commonly known as: NORVASC  Take 10 mg by mouth daily.   aspirin  EC 81 MG tablet Take 81 mg by mouth daily. Swallow whole.   Farxiga  10 MG Tabs tablet Generic drug: dapagliflozin  propanediol Take 10 mg by mouth daily.   gabapentin  600 MG tablet Commonly known as:  NEURONTIN  Take 600 mg by mouth See admin instructions. 300mg  in the morning and 600mg  at night   GLUCOSAMINE CHONDR 1500 COMPLX PO Take 2 tablets by mouth daily.   hydroxypropyl methylcellulose / hypromellose 2.5 % ophthalmic solution Commonly known as: ISOPTO TEARS / GONIOVISC Place 1 drop into both eyes daily as needed for dry eyes.   ibuprofen  200  MG tablet Commonly known as: ADVIL  Take 800 mg by mouth every 6 (six) hours as needed for mild pain (pain score 1-3) or moderate pain (pain score 4-6).   isosorbide  mononitrate 30 MG 24 hr tablet Commonly known as: IMDUR  Take 30 mg by mouth daily.   loratadine 10 MG tablet Commonly known as: CLARITIN Take 10 mg by mouth daily as needed for allergies.   metoprolol  succinate 25 MG 24 hr tablet Commonly known as: TOPROL -XL Take 0.5 tablets (12.5 mg total) by mouth daily.   multivitamin with minerals Tabs tablet Take 1 tablet by mouth daily.   rosuvastatin  40 MG tablet Commonly known as: CRESTOR  Take 1 tablet (40 mg total) by mouth daily.   Sodium Caprylate Powd Take 2,600 mg by mouth daily.   spironolactone  25 MG tablet Commonly known as: ALDACTONE  Take 25 mg by mouth daily.   tamsulosin  0.4 MG Caps capsule Commonly known as: FLOMAX  Take 1 capsule (0.4 mg total) by mouth 2 (two) times daily.   ticagrelor  90 MG Tabs tablet Commonly known as: BRILINTA  Take 1 tablet (90 mg total) by mouth 2 (two) times daily.   valsartan 320 MG tablet Commonly known as: DIOVAN Take 320 mg by mouth daily.        Follow up appointment: Dr. Wilburn - 03/30/2024  8:45 AM   POST-PCI CHECKLIST Aspirin  prescribed? Yes ADP Receptor Inhibitor prescribed? Yes - Brilinta  Statin prescribed? Yes - Crestor  40 Beta-blocker prescribed? Yes - Metoprolol  succinate 12.5 Cardiac rehab ordered? Yes   PLEASE BRING ALL MEDICATIONS WITH YOU TO FOLLOW UP APPOINTMENTS  Time spent with patient: 54  Signed: Danita Bloch PA-C 03/12/2024, 8:11 AM [facility]

## 2024-03-13 ENCOUNTER — Encounter: Payer: Self-pay | Admitting: Internal Medicine

## 2024-03-13 LAB — LIPOPROTEIN A (LPA): Lipoprotein (a): 11.9 nmol/L (ref <75.0)
